# Patient Record
Sex: Female | Born: 1955 | Hispanic: No | Marital: Married | Smoking: Never smoker
Health system: Southern US, Community
[De-identification: ages and names within clinical notes are randomized; demographics above are authoritative.]

## PROBLEM LIST (undated history)

## (undated) DIAGNOSIS — R252 Cramp and spasm: Secondary | ICD-10-CM

## (undated) DIAGNOSIS — Z862 Personal history of diseases of the blood and blood-forming organs and certain disorders involving the immune mechanism: Secondary | ICD-10-CM

## (undated) DIAGNOSIS — B360 Pityriasis versicolor: Secondary | ICD-10-CM

## (undated) DIAGNOSIS — Z5189 Encounter for other specified aftercare: Secondary | ICD-10-CM

## (undated) DIAGNOSIS — M1990 Unspecified osteoarthritis, unspecified site: Secondary | ICD-10-CM

## (undated) DIAGNOSIS — E785 Hyperlipidemia, unspecified: Secondary | ICD-10-CM

## (undated) DIAGNOSIS — K219 Gastro-esophageal reflux disease without esophagitis: Secondary | ICD-10-CM

## (undated) DIAGNOSIS — K922 Gastrointestinal hemorrhage, unspecified: Secondary | ICD-10-CM

## (undated) DIAGNOSIS — F419 Anxiety disorder, unspecified: Secondary | ICD-10-CM

## (undated) DIAGNOSIS — D649 Anemia, unspecified: Secondary | ICD-10-CM

## (undated) HISTORY — DX: Encounter for other specified aftercare: Z51.89

## (undated) HISTORY — DX: Gastro-esophageal reflux disease without esophagitis: K21.9

## (undated) HISTORY — DX: Anxiety disorder, unspecified: F41.9

## (undated) HISTORY — DX: Anemia, unspecified: D64.9

## (undated) HISTORY — DX: Cramp and spasm: R25.2

## (undated) HISTORY — DX: Gastrointestinal hemorrhage, unspecified: K92.2

## (undated) HISTORY — DX: Personal history of diseases of the blood and blood-forming organs and certain disorders involving the immune mechanism: Z86.2

## (undated) HISTORY — DX: Hyperlipidemia, unspecified: E78.5

## (undated) HISTORY — DX: Unspecified osteoarthritis, unspecified site: M19.90

## (undated) HISTORY — DX: Pityriasis versicolor: B36.0

---

## 2007-03-09 HISTORY — PX: BREAST SURGERY: SHX581

## 2010-03-08 DIAGNOSIS — K219 Gastro-esophageal reflux disease without esophagitis: Secondary | ICD-10-CM

## 2010-03-08 DIAGNOSIS — Z5189 Encounter for other specified aftercare: Secondary | ICD-10-CM

## 2010-03-08 DIAGNOSIS — D649 Anemia, unspecified: Secondary | ICD-10-CM

## 2010-03-08 HISTORY — PX: UPPER GASTROINTESTINAL ENDOSCOPY: SHX188

## 2010-03-08 HISTORY — DX: Gastro-esophageal reflux disease without esophagitis: K21.9

## 2010-03-08 HISTORY — DX: Encounter for other specified aftercare: Z51.89

## 2010-03-08 HISTORY — DX: Anemia, unspecified: D64.9

## 2014-03-08 DIAGNOSIS — B360 Pityriasis versicolor: Secondary | ICD-10-CM

## 2014-03-08 HISTORY — DX: Pityriasis versicolor: B36.0

## 2014-09-13 DIAGNOSIS — H9190 Unspecified hearing loss, unspecified ear: Secondary | ICD-10-CM | POA: Insufficient documentation

## 2014-09-13 DIAGNOSIS — IMO0001 Reserved for inherently not codable concepts without codable children: Secondary | ICD-10-CM | POA: Insufficient documentation

## 2014-09-13 DIAGNOSIS — J358 Other chronic diseases of tonsils and adenoids: Secondary | ICD-10-CM | POA: Insufficient documentation

## 2014-09-13 DIAGNOSIS — H7291 Unspecified perforation of tympanic membrane, right ear: Secondary | ICD-10-CM | POA: Insufficient documentation

## 2014-09-13 DIAGNOSIS — H547 Unspecified visual loss: Secondary | ICD-10-CM | POA: Insufficient documentation

## 2014-09-13 DIAGNOSIS — H7192 Unspecified cholesteatoma, left ear: Secondary | ICD-10-CM | POA: Insufficient documentation

## 2014-10-26 DIAGNOSIS — H9193 Unspecified hearing loss, bilateral: Secondary | ICD-10-CM

## 2014-10-26 DIAGNOSIS — J358 Other chronic diseases of tonsils and adenoids: Secondary | ICD-10-CM

## 2014-10-26 DIAGNOSIS — H7291 Unspecified perforation of tympanic membrane, right ear: Secondary | ICD-10-CM

## 2014-10-26 DIAGNOSIS — H547 Unspecified visual loss: Secondary | ICD-10-CM

## 2014-10-26 DIAGNOSIS — H7192 Unspecified cholesteatoma, left ear: Secondary | ICD-10-CM

## 2014-10-26 DIAGNOSIS — K149 Disease of tongue, unspecified: Secondary | ICD-10-CM | POA: Insufficient documentation

## 2014-10-26 DIAGNOSIS — M1990 Unspecified osteoarthritis, unspecified site: Secondary | ICD-10-CM

## 2014-12-30 ENCOUNTER — Encounter: Payer: Self-pay | Admitting: Internal Medicine

## 2014-12-30 ENCOUNTER — Ambulatory Visit (INDEPENDENT_AMBULATORY_CARE_PROVIDER_SITE_OTHER): Payer: Self-pay | Admitting: Internal Medicine

## 2014-12-30 ENCOUNTER — Ambulatory Visit: Payer: Self-pay | Admitting: Internal Medicine

## 2014-12-30 VITALS — BP 108/68 | HR 74 | Ht <= 58 in | Wt 134.0 lb

## 2014-12-30 DIAGNOSIS — K219 Gastro-esophageal reflux disease without esophagitis: Secondary | ICD-10-CM

## 2014-12-30 DIAGNOSIS — B360 Pityriasis versicolor: Secondary | ICD-10-CM | POA: Insufficient documentation

## 2014-12-30 DIAGNOSIS — L259 Unspecified contact dermatitis, unspecified cause: Secondary | ICD-10-CM

## 2014-12-30 NOTE — Progress Notes (Signed)
   Subjective:    Patient ID: Elizabeth Ruiz, female    DOB: Oct 15, 1955, 59 y.o.   MRN: [id number]  HPI  Here for follow up of what appeared to be a symmetric contact dermatitis.  Pt. Was seen for this on 12/06/2014. Given a prednisone burst and taper with good results.  We did get a look at her swimsuit, the latex did not correspond to areas of rash.  Pt. Was convinced this was an allergic reaction to crab she had eaten all week.    2.  Tinea Versicolor:  Pt. Has had this for many years as does her sister Elizabeth Ruiz.  Noted this when checking her skin for the rash at last visit.  Pt. States worse at end of summer.  Her sister uses unknown topical intermittently to control--good control with.    3.  GERD:  States is taking her Famotidine 40 mg every night.  Is elevating her head with a ramp of pillows.  No heartburn now.    4.  Lips and tongue irritation:  Better on Vitamin B complex tablets.    Review of Systems     Objective:   Physical Exam  Skin of Back:  Coalescing pink ovules with mild flaking on upper and mid back and upper arms.        Assessment & Plan:  1.  Contact Dermatitis vs. Allergic reaction:  Doubt this was an allergic reaction to Crab.  Still suspect more so a contact dermatitis.  REsolved.  2.  Tinea Versicolor:  Two options:  To see if sister's selenium sulfide lotion is 2.5% and utilize once daily fir 7 days or may try Terbinafine cream 1% twice daily fo 7 days.    3.  GERD:  Controlled with Famotidine  4.  Tongue and lip complaints--possibly better with Vitamin B complex

## 2014-12-30 NOTE — Patient Instructions (Signed)
You may try selenium sulfide shampoo/lotion once daily (apply and wash off in 10 minutes once daily) for 1 week  OR You may try Terbinafine 1 %:  Apply twice daily for 1 week.

## 2015-04-01 ENCOUNTER — Encounter: Payer: Self-pay | Admitting: Internal Medicine

## 2015-04-01 ENCOUNTER — Telehealth: Payer: Self-pay | Admitting: Internal Medicine

## 2015-04-01 ENCOUNTER — Ambulatory Visit (INDEPENDENT_AMBULATORY_CARE_PROVIDER_SITE_OTHER): Payer: Self-pay | Admitting: Internal Medicine

## 2015-04-01 VITALS — BP 100/68 | HR 76 | Resp 16 | Ht <= 58 in | Wt 136.0 lb

## 2015-04-01 DIAGNOSIS — H9193 Unspecified hearing loss, bilateral: Secondary | ICD-10-CM

## 2015-04-01 DIAGNOSIS — K219 Gastro-esophageal reflux disease without esophagitis: Secondary | ICD-10-CM

## 2015-04-01 DIAGNOSIS — B360 Pityriasis versicolor: Secondary | ICD-10-CM

## 2015-04-01 NOTE — Progress Notes (Signed)
   Subjective:    Patient ID: Elizabeth Ruiz, female    DOB: 03-Apr-1955, 60 y.o.   MRN: [id number]  HPI   1. Tinea versicolor:  Used Terbinafine cream 1%.  Used for 4-5 days once daily.  Feels the skin changes have resolved.  2.  GERD:  Stopped taking Famotidine regularly.  Has not needed for two weeks.   3.  Decreased Hearing:  Unable to find [facility] report from this past summer--not in previous EMR as well.  Had decreased mixed hearing bilaterally, L>R when had [facility] read report over phone.  Pt. Was also referred through [facility] orange card to ENT, but never received the follow up.           Review of Systems     Objective:   Physical Exam HEENT;  Thickened TMs bilaterally--anterior perforation on right.  Left retracted, thickened and scarred but also wet anteriorly.  No definitive perforation. Lungs:  CTA CV:  RRR without murmur or rub. Skin:  No scaling pink lesions on back currently.       Assessment & Plan:  1.  Tinea versicolor:  Controlled.  Discussed treating with Terbinafine cream otc twice daily for 7-14 days if recurs, which is likely.  2.  GERD:  Plans to just use Famotidine as needed.    3.  Bilateral hearing loss:  Received documentation from ENT, Dr. Mickie Hillier office.  Was to return to his office after hearing testing performed, which did not occur.  Called [name] at [facility] (after told need to go through network from Dr [employer] office) to see about getting her back to him.

## 2015-04-01 NOTE — Telephone Encounter (Signed)
Pat called Marzetta Board at [facility] inquire regarding ENT referral for patient.  Stacy informed that patient had service with Dr. Lucia Gaskins on October 03, 2014 at 1:30 p.m.  Pat called Dr. Pollie Friar office to request report of visit.  This information will be faxed to [facility].

## 2015-04-02 ENCOUNTER — Telehealth: Payer: Self-pay | Admitting: Internal Medicine

## 2015-04-02 ENCOUNTER — Encounter: Payer: Self-pay | Admitting: Internal Medicine

## 2015-04-02 DIAGNOSIS — K219 Gastro-esophageal reflux disease without esophagitis: Secondary | ICD-10-CM | POA: Insufficient documentation

## 2015-04-11 NOTE — Telephone Encounter (Signed)
Patient is aware and will contact us if she does not hear from Dr. Lucia Gaskins in the next month

## 2015-04-11 NOTE — Telephone Encounter (Signed)
Called Elizabeth Ruiz to get update about referral. Marzetta Board states she contacted ENT office and was told that they only see patients with orange card one time only and if they need to go back they will be charge as self pay (not sure what the fee is)

## 2015-05-15 NOTE — Telephone Encounter (Signed)
Patient came in today to see if we knew what the self pay cost for her ENT visit with Dr. Lucia Gaskins would be. Called the ENT office today and spoke to front desk about it and they stated someone will get back to me with that information. Also called Stacy last week and she was going to find out and call me back. I have not hear back from Kelford yet. I called her again today and left voice message to return my call  Patient is informed we will be calling her as soon as we hear from them an answer Marlowe Kays from Dr. Lucia Gaskins office states patient would need to pay $95.00 for the visit. If patient agrees to pay, she will need to call them and set up the appointment.

## 2015-05-15 NOTE — Telephone Encounter (Signed)
Patient is informed of the $95.00 self pay  fee for the visit and to call Dr. Pollie Friar office at [phone] to set up appointment.

## 2015-09-11 ENCOUNTER — Encounter: Payer: Self-pay | Admitting: Internal Medicine

## 2015-09-11 ENCOUNTER — Telehealth: Payer: Self-pay | Admitting: Internal Medicine

## 2015-09-11 ENCOUNTER — Ambulatory Visit (INDEPENDENT_AMBULATORY_CARE_PROVIDER_SITE_OTHER): Payer: Self-pay | Admitting: Internal Medicine

## 2015-09-11 VITALS — BP 114/78 | HR 69 | Temp 98.2°F | Resp 14 | Ht <= 58 in | Wt 136.0 lb

## 2015-09-11 DIAGNOSIS — K029 Dental caries, unspecified: Secondary | ICD-10-CM

## 2015-09-11 DIAGNOSIS — Z1239 Encounter for other screening for malignant neoplasm of breast: Secondary | ICD-10-CM

## 2015-09-11 DIAGNOSIS — Z124 Encounter for screening for malignant neoplasm of cervix: Secondary | ICD-10-CM

## 2015-09-11 DIAGNOSIS — M6530 Trigger finger, unspecified finger: Secondary | ICD-10-CM

## 2015-09-11 DIAGNOSIS — Z0000 Encounter for general adult medical examination without abnormal findings: Secondary | ICD-10-CM

## 2015-09-11 DIAGNOSIS — R0600 Dyspnea, unspecified: Secondary | ICD-10-CM

## 2015-09-11 MED ORDER — DICLOFENAC SODIUM 1 % TD GEL: 2.0000 g | Freq: Four times a day (QID) | TRANSDERMAL | Status: DC

## 2015-09-11 NOTE — Progress Notes (Signed)
Subjective:    Patient ID: Elizabeth Ruiz, female    DOB: [birth year], 60 y.o.   MRN: [id number]  HPI   Here for CPE with pap.  Health Maintenance:  Last Pap:  7-8 years ago.  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  SBE:  No  Guaiac Cards:  Never  Colonoscopy:  Never  DEXA:  Never  Immunization History  Administered Date(s) Administered  . Influenza-Unspecified 11/23/2014     Current outpatient prescriptions:  .  famotidine (PEPCID) 20 MG tablet, Take 20 mg by mouth. 2 tablets at bedtime, Disp: , Rfl:  .  Calcium Carbonate-Vit D-Min (CALCIUM 600 + MINERALS PO), Take 1 tablet by mouth 2 (two) times daily with a meal. Reported on 09/11/2015, Disp: , Rfl:    No Known Allergies   Past Medical History  Diagnosis Date  . Upper GI bleed     NSAID provoked gastritis  . Arthritis   . Tinea versicolor 2016  . GERD (gastroesophageal reflux disease)     Past Surgical History  Procedure Laterality Date  . Breast surgery Right 2009    Brease Biopsy with benign Fibroma resulting    Family History  Problem Relation Age of Onset  . Diabetes Mellitus II Mother   . Hypercholesterolemia Father   . Diabetes Mellitus II Sister           Review of Systems  Constitutional: Negative for fatigue.  HENT: Positive for dental problem, hearing loss and tinnitus. Negative for trouble swallowing.        Some cavities that need attention.  Has been evaluated by [facility] and ENT:  Reportedly needs surgery to repair ?TM to improve hearing.  Has been told the "nerves"  To her ears are okay.  Eyes: Negative for visual disturbance.       Wears glasses, working well for her vision. Last eye check 6 months ago--[address] [facility]  Respiratory: Positive for shortness of breath. Negative for cough.        Swims laps 20 minutes without dyspnea (not for a month,  however), but has noted some dyspnea in past 5 years if walks up 1 flight of stairs--mild.   Has been asked why she is breathing heavily while walking and talking on phone. Husband, Gjerji, has not noted her breathing heavily with walking. No itchy, watery eyes, nose or throat.    Cardiovascular: Negative for chest pain, palpitations and leg swelling.  Endocrine: Negative for cold intolerance, polydipsia and polyuria.  Genitourinary: Negative for dysuria, urgency and hematuria.       No menstruation since age 69 yo.  Musculoskeletal: Positive for arthralgias.       Right handed.  Has pain at MCP joint of right thumb.  Has been painful for 2 weeks.  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  Has not taken anything for the pain.  Has Voltaren cream from her sister and has helped.  Neurological: Negative for dizziness, weakness and numbness.  Hematological: Does not bruise/bleed easily.  Psychiatric/Behavioral: Negative for dysphoric mood. The patient is not nervous/anxious.        Objective:   Physical Exam  Constitutional: She is oriented to person, place, and time. She appears well-developed and well-nourished.  HENT:  Head: Normocephalic and atraumatic.  Right Ear: External ear normal.  Left  Ear: External ear normal.  Nose: Nose normal.  Mouth/Throat: Oropharynx is clear and moist.  Eyes: Conjunctivae and EOM are normal. Pupils are equal, round, and reactive to light.  Fundoscopic exam:      The right eye shows no AV nicking, no exudate and no hemorrhage.       The left eye shows no AV nicking, no exudate and no hemorrhage.  Discs sharp Left eye with mild prominence/proptosis compared to right, as is her baseline and without change  Neck: Normal range of motion. Neck supple. No thyroid mass and no thyromegaly present.  Cardiovascular: Normal rate, regular rhythm, S1 normal, S2 normal and normal heart sounds.  Exam reveals no S3, no S4  and no friction rub.   No murmur heard. No carotid bruits.  Carotid, radial, femoral, DP and PT pulses normal and equal.    Pulmonary/Chest: Effort normal and breath sounds normal. No accessory muscle usage. No respiratory distress. She has no wheezes. She has no rales. Right breast exhibits no inverted nipple, no mass, no nipple discharge, no skin change and no tenderness. Left breast exhibits inverted nipple. Left breast exhibits no mass, no nipple discharge, no skin change and no tenderness.  Chest resonant to percussion  Abdominal: Soft. Bowel sounds are normal. She exhibits no mass. There is no hepatosplenomegaly. There is no tenderness. No hernia. Hernia confirmed negative in the right inguinal area and confirmed negative in the left inguinal area.  Genitourinary: Rectum normal, vagina normal and uterus normal. Rectal exam shows no mass, no tenderness and anal tone normal. Guaiac negative stool. Cervix exhibits no motion tenderness and no discharge. Right adnexum displays no mass, no tenderness and no fullness. Left adnexum displays no mass, no tenderness and no fullness.  Musculoskeletal: Normal range of motion.       Arms: Lymphadenopathy:       Head (right side): No submental and no submandibular adenopathy present.       Head (left side): No submandibular adenopathy present.    She has no cervical adenopathy.    She has no axillary adenopathy.       Right: No inguinal adenopathy present.       Left: No inguinal adenopathy present.  Neurological: She is alert and oriented to person, place, and time. She has normal reflexes. No cranial nerve deficit or sensory deficit. She exhibits normal muscle tone. Coordination and gait normal.  Skin: Skin is warm and dry. No rash noted.  Back with many light brown to dark brown well circumscribed lesions.  No flaking or bleeding.  Psychiatric: She has a normal mood and affect. Her behavior is normal. Judgment and thought content normal.            Assessment & Plan:  1.  CPE with Pap Pap taken and sent Wet Prep with clue cells, negative whiff and patient without symptoms. Schedule Mammogram through scholarship FLP, CMP, CBC today. Guaiac Cards x 3, to return when back for trigger injection.  Guaiac card negative for blood today. Tdap did not want today--she does not feel at this age she requires the immunization despite our counseling and recommendation.   2.  Left TM damage, reportedly requiring surgery.  Believe pt. Sent to Dr. Lucia Gaskins through [facility] orange card.  Have not received most recent office note.  Will discuss possibility of surgery with Dr. Lucia Gaskins and whether to be done at [facility], where family can get Financial Assistance.  3.  Trigger finger, right thumb:  Will return  for injection.  Check with [facility] regarding Diclofenac Cream twice daily.  4.  Dental Decay:  Dental referral  5.  Dyspnea:  EKG WNL.  Discussed to gradually increase daily physical activity.  Feel this is likely due to her lack of physical activity in recent year.

## 2015-09-11 NOTE — Telephone Encounter (Signed)
Called Dr. Pollie Friar office, ENT, where patient ultimately was seen in March for her left ear concerns.  They will fax her visit there.  I will look at recommendations and then discuss with Dr. Lucia Gaskins subsequently.

## 2015-09-12 LAB — CBC WITH DIFFERENTIAL/PLATELET
Basophils Absolute: 0 10*3/uL (ref 0.0–0.2)
Basos: 1 %
EOS (ABSOLUTE): 0.2 10*3/uL (ref 0.0–0.4)
EOS: 4 %
HEMATOCRIT: 37.2 % (ref 34.0–46.6)
HEMOGLOBIN: 11.9 g/dL (ref 11.1–15.9)
IMMATURE GRANS (ABS): 0 10*3/uL (ref 0.0–0.1)
IMMATURE GRANULOCYTES: 0 %
LYMPHS: 43 %
Lymphocytes Absolute: 2.5 10*3/uL (ref 0.7–3.1)
MCH: 29.8 pg (ref 26.6–33.0)
MCHC: 32 g/dL (ref 31.5–35.7)
MCV: 93 fL (ref 79–97)
MONOCYTES: 8 %
Monocytes Absolute: 0.4 10*3/uL (ref 0.1–0.9)
NEUTROS PCT: 44 %
Neutrophils Absolute: 2.6 10*3/uL (ref 1.4–7.0)
Platelets: 281 10*3/uL (ref 150–379)
RBC: 4 x10E6/uL (ref 3.77–5.28)
RDW: 13.5 % (ref 12.3–15.4)
WBC: 5.8 10*3/uL (ref 3.4–10.8)

## 2015-09-12 LAB — COMPREHENSIVE METABOLIC PANEL
ALBUMIN: 4.2 g/dL (ref 3.5–5.5)
ALT: 6 IU/L (ref 0–32)
AST: 14 IU/L (ref 0–40)
Albumin/Globulin Ratio: 1.4 (ref 1.2–2.2)
Alkaline Phosphatase: 58 IU/L (ref 39–117)
BUN/Creatinine Ratio: 17 (ref 9–23)
BUN: 11 mg/dL (ref 6–24)
Bilirubin Total: 0.6 mg/dL (ref 0.0–1.2)
CALCIUM: 9.3 mg/dL (ref 8.7–10.2)
CO2: 24 mmol/L (ref 18–29)
CREATININE: 0.66 mg/dL (ref 0.57–1.00)
Chloride: 102 mmol/L (ref 96–106)
GFR calc Af Amer: 112 mL/min/{1.73_m2} (ref >59)
GFR, EST NON AFRICAN AMERICAN: 97 mL/min/{1.73_m2} (ref >59)
GLOBULIN, TOTAL: 3 g/dL (ref 1.5–4.5)
Glucose: 89 mg/dL (ref 65–99)
Potassium: 4.3 mmol/L (ref 3.5–5.2)
SODIUM: 141 mmol/L (ref 134–144)
TOTAL PROTEIN: 7.2 g/dL (ref 6.0–8.5)

## 2015-09-12 LAB — LIPID PANEL W/O CHOL/HDL RATIO
Cholesterol, Total: 233 mg/dL — ABNORMAL HIGH (ref 100–199)
HDL: 76 mg/dL (ref >39)
LDL Calculated: 137 mg/dL — ABNORMAL HIGH (ref 0–99)
Triglycerides: 99 mg/dL (ref 0–149)
VLDL Cholesterol Cal: 20 mg/dL (ref 5–40)

## 2015-09-12 LAB — CYTOLOGY - PAP

## 2015-09-15 ENCOUNTER — Ambulatory Visit (INDEPENDENT_AMBULATORY_CARE_PROVIDER_SITE_OTHER): Payer: Self-pay | Admitting: Internal Medicine

## 2015-09-15 DIAGNOSIS — M6530 Trigger finger, unspecified finger: Secondary | ICD-10-CM

## 2015-09-15 MED ORDER — METHYLPREDNISOLONE ACETATE 40 MG/ML IJ SUSP: 40.0000 mg | Freq: Once | INTRAMUSCULAR | Status: DC

## 2015-09-15 NOTE — Progress Notes (Addendum)
Patient here for injection of right trigger thumb.  She did not purchase SPICA splint as recommended at last visit to wear for 14 days after injection. Discussed possible complications and benefits. Patient signed informed consent paperwork.  Right palmar base of thumb cleaned in sterile fashion, Lidocaine 1% 0.5 ml and 1 ml or 40 mg of Dep Medrol injected to pulley area of triggering tendon.  Pt. Tolerated well without complication.   Bandaid applied  UNable to enter LOt and Expiration in chart the usual way,  Depo-Medrol 40 mg/ml with Lot:  [id number] and exp of 09/2016  A/P:  Right Triggering Thumb:  Injected at pulley site.  To splint for 14 days with removable SPICA splint.  Gradual, gentle ROM thereafter. To call for signs of infection:  Redness, swelling, discharge, increased pain, or fever.  Also, discussed normal pap, labs done last week.

## 2015-09-15 NOTE — Addendum Note (Signed)
Addended by: Marcelino Duster on: 09/15/2015 04:59 PM   Modules accepted: Orders

## 2015-09-15 NOTE — Patient Instructions (Addendum)
Right SPICA splint:  Go to [facility] in splint area or [facility] or [facility] Wear splint on right thumb for 2 weeks.  Only remove for washing hands or taking a shower or swimming.  Call for redness, swelling, fever, increased pain

## 2015-09-22 ENCOUNTER — Telehealth (HOSPITAL_COMMUNITY): Payer: Self-pay | Admitting: *Deleted

## 2015-09-22 NOTE — Telephone Encounter (Signed)
Telephoned patient at home # and left message to return call to [facility] 

## 2016-01-12 ENCOUNTER — Encounter: Payer: Self-pay | Admitting: Internal Medicine

## 2016-02-13 ENCOUNTER — Encounter: Payer: Self-pay | Admitting: Internal Medicine

## 2016-02-13 ENCOUNTER — Ambulatory Visit (INDEPENDENT_AMBULATORY_CARE_PROVIDER_SITE_OTHER): Payer: Self-pay | Admitting: Internal Medicine

## 2016-02-13 VITALS — BP 112/82 | HR 68 | Resp 12 | Ht <= 58 in | Wt 141.0 lb

## 2016-02-13 DIAGNOSIS — H7192 Unspecified cholesteatoma, left ear: Secondary | ICD-10-CM

## 2016-02-13 DIAGNOSIS — H9193 Unspecified hearing loss, bilateral: Secondary | ICD-10-CM

## 2016-02-13 DIAGNOSIS — H7291 Unspecified perforation of tympanic membrane, right ear: Secondary | ICD-10-CM

## 2016-02-13 DIAGNOSIS — H8303 Labyrinthitis, bilateral: Secondary | ICD-10-CM

## 2016-02-13 NOTE — Progress Notes (Signed)
   Subjective:    Patient ID: Elizabeth Ruiz, female    DOB: 1955-05-09, 60 y.o.   MRN: [id number]  HPI   Balance or spinning sensation with head movement in past 3 weeks.  States especially when rolling over in bed or bending over to tie shoes.   Her symptoms have improved with time--lasts fewer seconds. Can have nausea with dizziness.   Did have left ear pain and maybe congestion about 1 month ago.   Does get numbness and tingling in her bilateral hands, but not associated with her vertiginous symptoms--generally at night.   Has had some pain in left cervical paraspinous musculature about the same time period--constant.    Has not taken any medication for any of these symptoms.    No outpatient prescriptions have been marked as taking for the 02/13/16 encounter (Office Visit) with Mack Hook, MD.    No Known Allergies      Review of Systems     Objective:   Physical Exam   NAD HEENT:  PERRL, EOMI, TMs  Right with perforation,  left with thickening, scarring and possible cholesteotoma anteriorly--area is wet appearing.  Throat without injection. Neck:  Supple, No adenopathy Chest:  CTA CV:  RRR with normal S1 and S2, No S3, S4 or murmur. Neuro:  A & O x 3, CN II-XII grossly intact, Motor 5/5, DTRs 2+/4 throughout.  Rapid alternating motions, finger to nose to finger normal. Romberg negative.  Gait normal.   Christella Noa maneuver negative for nystagmus, but does have mild vertiginous symptoms when sits back up         Assessment & Plan:  1.  Labrynthitis:  Appears to be resolving.  Discussed medication would more likely cause sedation rather than really helping at this point.  To call if does not gradually resolve.  2.  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  Will refer to [facility] to see if able to obtain treatment there.  3.  Neck pain:  Ibuprofen and  warm packs as needed.

## 2016-02-13 NOTE — Patient Instructions (Addendum)
Ibuprofen 200 mg try 2-3 every 6 hours with food for neck pain

## 2016-05-19 ENCOUNTER — Ambulatory Visit (INDEPENDENT_AMBULATORY_CARE_PROVIDER_SITE_OTHER): Payer: Self-pay | Admitting: Internal Medicine

## 2016-05-19 ENCOUNTER — Encounter: Payer: Self-pay | Admitting: Internal Medicine

## 2016-05-19 VITALS — BP 124/82 | HR 78 | Resp 14 | Ht <= 58 in | Wt 141.0 lb

## 2016-05-19 DIAGNOSIS — G8929 Other chronic pain: Secondary | ICD-10-CM

## 2016-05-19 DIAGNOSIS — M25562 Pain in left knee: Secondary | ICD-10-CM

## 2016-05-19 MED ORDER — TART CHERRY ADVANCED PO CAPS: ORAL_CAPSULE | ORAL | Status: DC

## 2016-05-19 NOTE — Patient Instructions (Signed)
[facility] [address]  [place], [place] [address] [phone]  Ask for Zannie Cove and have them give you a recommended dosing for ongoing pain and then preventive dosing once the pain has improved.    40 % discount for [facility] patients

## 2016-05-19 NOTE — Progress Notes (Signed)
   Subjective:    Patient ID: Elizabeth Ruiz, female    DOB: January 05, 1956, 61 y.o.   MRN: [id number]  HPI   1.  Pain in left knee started soon after being seen 02/12/2017.  Noted the pain started to run to catch a bus.  Did not fall and injure.  Had really bad pain 2 days later.  Has seemed to gradually worsen with time.  Has noted swelling behind her knee.  No definite erythema associated.  Now with pain at times even when just lying down.  Does not use a pillow to support knee from beneath.  Pain is worse with weight bearing.   Has never had problems with her left knee before.  Has never had it xrayed.   Voltaren gel,which she had left over helps, but just a bit.    2. Hair loss:  Would like to have thyroid checked  Current Meds  Medication Sig  . Omega-3 Fatty Acids (OMEGA-3 FISH OIL PO) Take by mouth daily.   No Known Allergies   Review of Systems    Objective:   Physical Exam NAD Bilateral knees with adiposity. Minimal decrease in flexion with left knee.  Normal extension and extension against opposing force.  NT with compression of patella against anterior knee.   Tender all around anterior and medial lateral joint line, but really nontender over posterior knee today and no swelling noted.  Mild tenderness in upper gastroc area without palpation of swelling or mass. No effusion, no other soft tissue swelling.       Assessment & Plan:  1.  Left Knee pain:  Not clear if internal derangement or OA changes.  No palpation of Baker's cyst, though with extra soft tissue may be missing.   Send for Xray at [facility] and apply for financial assistance.  2.  Hair Loss:  Had planned to obtain TSH, but failed to send to lab after appt.  Will check at next appt.  3.  Trigger thumb:  This was a BTW at discharge:  Still a problem.  Will see what we find with knee and consider ortho referral to [facility] for both concerns if necessary or just the thumb.  4.  Hearing loss with perforated TM:  Referral  to [facility] has not been completed from December.  Given financial assistance paperwork to get started and set up appt.  Could not afford needed surgery through ENT locally.

## 2016-05-24 ENCOUNTER — Ambulatory Visit (HOSPITAL_COMMUNITY)
Admission: RE | Admit: 2016-05-24 | Discharge: 2016-05-24 | Disposition: A | Discharge status: Home / Self Care | Payer: Self-pay | Source: Ambulatory Visit | Attending: Internal Medicine | Admitting: Internal Medicine

## 2016-05-24 DIAGNOSIS — M1712 Unilateral primary osteoarthritis, left knee: Secondary | ICD-10-CM | POA: Insufficient documentation

## 2016-05-24 DIAGNOSIS — M25562 Pain in left knee: Secondary | ICD-10-CM | POA: Insufficient documentation

## 2016-05-24 DIAGNOSIS — G8929 Other chronic pain: Secondary | ICD-10-CM | POA: Insufficient documentation

## 2016-07-16 ENCOUNTER — Other Ambulatory Visit: Payer: Self-pay | Admitting: Obstetrics and Gynecology

## 2016-07-16 DIAGNOSIS — Z1231 Encounter for screening mammogram for malignant neoplasm of breast: Secondary | ICD-10-CM

## 2016-07-29 ENCOUNTER — Ambulatory Visit
Admission: RE | Admit: 2016-07-29 | Discharge: 2016-07-29 | Disposition: A | Discharge status: Home / Self Care | Payer: No Typology Code available for payment source | Source: Ambulatory Visit | Attending: Obstetrics and Gynecology | Admitting: Obstetrics and Gynecology

## 2016-07-29 ENCOUNTER — Encounter (HOSPITAL_COMMUNITY): Payer: Self-pay

## 2016-07-29 ENCOUNTER — Ambulatory Visit (HOSPITAL_COMMUNITY)
Admission: RE | Admit: 2016-07-29 | Discharge: 2016-07-29 | Disposition: A | Discharge status: Home / Self Care | Payer: Self-pay | Source: Ambulatory Visit | Attending: Obstetrics and Gynecology | Admitting: Obstetrics and Gynecology

## 2016-07-29 VITALS — BP 128/84 | Temp 97.6°F | Wt 140.8 lb

## 2016-07-29 DIAGNOSIS — Z1231 Encounter for screening mammogram for malignant neoplasm of breast: Secondary | ICD-10-CM

## 2016-07-29 DIAGNOSIS — Z1239 Encounter for other screening for malignant neoplasm of breast: Secondary | ICD-10-CM

## 2016-07-29 NOTE — Progress Notes (Signed)
No complaints today.   Pap Smear: Pap smear not completed today. Last Pap smear was 09/11/2015 at the [facility] and normal. Per patient has no history of an abnormal Pap smear. Last Pap smear result is in EPIC.  Physical exam: Breasts Breasts symmetrical. No skin abnormalities bilateral breasts. No nipple retraction bilateral breasts. No nipple discharge bilateral breasts. No lymphadenopathy. No lumps palpated bilateral breasts. No complaints of pain or tenderness on exam. Referred patient to the [facility] for a screening mammogram. Appointment scheduled for Thursday, Jul 29, 2016 at 1110.        Pelvic/Bimanual No Pap smear completed today since last Pap smear was 09/11/2015. Pap smear not indicated per [facility] guidelines.   Smoking History: Patient has never smoked.  Patient Navigation: Patient education provided. Access to services provided for patient through [facility] program.   Colorectal Cancer Screening: Per patient has never had a colonoscopy completed. No complaints today. FIT Test given to patient to complete and return to [facility].

## 2016-07-29 NOTE — Patient Instructions (Signed)
Explained breast self awareness with [employer]. Patient did not need a Pap smear today due to last Pap smear was 09/11/2015. Let her know [facility] will cover Pap smears every 3 years unless has a history of abnormal Pap smears. Referred patient to the [facility] for a screening mammogram. Appointment scheduled for Thursday, Jul 29, 2016 at 1110. Let patient know the [facility] will follow up with her within the next couple weeks with results of mammogram by letter or phone. Nylani Lundy verbalized understanding.  [place], Arvil Chaco, RN 1:38 PM

## 2016-08-03 ENCOUNTER — Encounter (HOSPITAL_COMMUNITY): Payer: Self-pay | Admitting: *Deleted

## 2016-08-04 ENCOUNTER — Other Ambulatory Visit: Payer: Self-pay

## 2016-08-13 LAB — FECAL OCCULT BLOOD, IMMUNOCHEMICAL: Fecal Occult Bld: NEGATIVE

## 2016-08-23 ENCOUNTER — Encounter: Payer: Self-pay | Admitting: Internal Medicine

## 2016-08-23 ENCOUNTER — Ambulatory Visit (INDEPENDENT_AMBULATORY_CARE_PROVIDER_SITE_OTHER): Payer: Self-pay | Admitting: Internal Medicine

## 2016-08-23 VITALS — BP 118/78 | HR 62 | Resp 12 | Ht <= 58 in | Wt 141.0 lb

## 2016-08-23 DIAGNOSIS — Z862 Personal history of diseases of the blood and blood-forming organs and certain disorders involving the immune mechanism: Secondary | ICD-10-CM

## 2016-08-23 DIAGNOSIS — M1990 Unspecified osteoarthritis, unspecified site: Secondary | ICD-10-CM

## 2016-08-23 DIAGNOSIS — E785 Hyperlipidemia, unspecified: Secondary | ICD-10-CM

## 2016-08-23 DIAGNOSIS — H6592 Unspecified nonsuppurative otitis media, left ear: Secondary | ICD-10-CM

## 2016-08-23 DIAGNOSIS — R42 Dizziness and giddiness: Secondary | ICD-10-CM

## 2016-08-23 DIAGNOSIS — E7800 Pure hypercholesterolemia, unspecified: Secondary | ICD-10-CM

## 2016-08-23 DIAGNOSIS — M1811 Unilateral primary osteoarthritis of first carpometacarpal joint, right hand: Secondary | ICD-10-CM

## 2016-08-23 DIAGNOSIS — M7050 Other bursitis of knee, unspecified knee: Secondary | ICD-10-CM

## 2016-08-23 HISTORY — DX: Hyperlipidemia, unspecified: E78.5

## 2016-08-23 HISTORY — DX: Personal history of diseases of the blood and blood-forming organs and certain disorders involving the immune mechanism: Z86.2

## 2016-08-23 MED ORDER — AMOXICILLIN 500 MG PO CAPS: 500.0000 mg | ORAL_CAPSULE | Freq: Three times a day (TID) | ORAL | 0 refills | Status: DC

## 2016-08-23 NOTE — Progress Notes (Signed)
   Subjective:    Patient ID: Elizabeth Ruiz, female    DOB: 10/24/1955, 61 y.o.   MRN: [id number]  HPI  1.  Left Knee Pain:  Not much pain, but not walking well.  Not clear if stiffness or if she is having pain, but just used to the discomfort.  Her Xray showed tricompartmental DJD changes.  She decided to just give it more time.   She did start tart cherry, perhaps this has helped.  2.  Episode of vertigo again about 1 month ago.  Fell to floor with room spinning.  Has had small episodes since.  History of vertigo.  Sometimes gets nauseated, but no vomiting.  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  3.  Mild hyperlipidemia with high HDL.  HDL was 76  4.  Would like to be checked for anemia--has had in the past with upper GI bleed years ago.  Feels a bit week at times.    5.  Right thumb not necessarily triggering, but discomfort and stiffness.  Current Meds  Medication Sig  . Misc Natural Products (TART CHERRY ADVANCED) CAPS Use as recommended by [facility]    No Known Allergies Review of Systems     Objective:   Physical Exam   NAD HEENT:  PERRL, EOMI, right TM with scarring anteriorly.  Cannot make out perforation today.  Left TM erythematous diffusely and retracted.  Throat without injection.   Neck:  Supple, No adenopathy.  Tender over left paraspinous tendons to left nuchal area insertion. Chest:  CTA CV:  RRR without murmur or rub,  No carotid bruits,  Carotid, radial pulses normal and equal. Right thumb with hypertrophic change at MCP and IP joint.  Mild decrease in flexion ROM, but no triggering noted today. Left knee with tenderness actually at medial muscle/soft tissue area to pes anserine insertion/bursa site.  No definitive tenderness of joint lines, popliteal fossa or with stress of cruciates or collateral ligaments.  No palpable effusion.        Assessment & Plan:  1.  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  Continue Ebony.  Not a candidate for NSAIDS with history of UGI bleed.  2.  Vertigo: likely related to ear concerns:  Checking to see why she never received a referral to [facility] in [facility].  Was told she needed surgery here, but was unable to afford the surgery.  3.  Left OM--appears acute:  Amoxicillin 500 mg 3 times daily for 10 days.  Will recheck in 1 month.  4.  Hyperlipidemia with good HDL:  FLP  5.  History of anemia:  CBC  6.  HM:  Patient not interested in CPE this year.  Did have normal Mammogram in May.  Discussed would recommend screening for her regularly as she is generally quite healthy and would expect her to be much longer lived and be able to tolerated screening well also. Tdap next visit if will allow--refused in July of 2017.

## 2016-08-24 LAB — CBC WITH DIFFERENTIAL/PLATELET
BASOS ABS: 0.1 10*3/uL (ref 0.0–0.2)
BASOS: 1 %
EOS (ABSOLUTE): 0.1 10*3/uL (ref 0.0–0.4)
Eos: 3 %
HEMOGLOBIN: 11.9 g/dL (ref 11.1–15.9)
Hematocrit: 37.9 % (ref 34.0–46.6)
IMMATURE GRANS (ABS): 0 10*3/uL (ref 0.0–0.1)
IMMATURE GRANULOCYTES: 0 %
LYMPHS: 39 %
Lymphocytes Absolute: 2.2 10*3/uL (ref 0.7–3.1)
MCH: 29.8 pg (ref 26.6–33.0)
MCHC: 31.4 g/dL — ABNORMAL LOW (ref 31.5–35.7)
MCV: 95 fL (ref 79–97)
MONOCYTES: 7 %
Monocytes Absolute: 0.4 10*3/uL (ref 0.1–0.9)
NEUTROS PCT: 50 %
Neutrophils Absolute: 2.8 10*3/uL (ref 1.4–7.0)
PLATELETS: 274 10*3/uL (ref 150–379)
RBC: 3.99 x10E6/uL (ref 3.77–5.28)
RDW: 13.3 % (ref 12.3–15.4)
WBC: 5.5 10*3/uL (ref 3.4–10.8)

## 2016-08-24 LAB — LIPID PANEL W/O CHOL/HDL RATIO
CHOLESTEROL TOTAL: 255 mg/dL — AB (ref 100–199)
HDL: 70 mg/dL (ref >39)
LDL Calculated: 159 mg/dL — ABNORMAL HIGH (ref 0–99)
Triglycerides: 131 mg/dL (ref 0–149)
VLDL CHOLESTEROL CAL: 26 mg/dL (ref 5–40)

## 2016-08-25 ENCOUNTER — Encounter (HOSPITAL_COMMUNITY): Payer: Self-pay | Admitting: *Deleted

## 2016-08-25 NOTE — Progress Notes (Signed)
Letter mailed to patient with negative Fit Test results.  

## 2016-10-04 ENCOUNTER — Ambulatory Visit: Payer: Self-pay | Admitting: Internal Medicine

## 2016-11-01 ENCOUNTER — Ambulatory Visit: Payer: Self-pay | Admitting: Internal Medicine

## 2016-12-27 ENCOUNTER — Encounter: Payer: Self-pay | Admitting: Internal Medicine

## 2016-12-27 ENCOUNTER — Ambulatory Visit (INDEPENDENT_AMBULATORY_CARE_PROVIDER_SITE_OTHER): Payer: Self-pay | Admitting: Internal Medicine

## 2016-12-27 VITALS — BP 128/82 | HR 78 | Resp 12 | Ht <= 58 in | Wt 139.0 lb

## 2016-12-27 DIAGNOSIS — Z23 Encounter for immunization: Secondary | ICD-10-CM

## 2016-12-27 DIAGNOSIS — E7800 Pure hypercholesterolemia, unspecified: Secondary | ICD-10-CM

## 2016-12-27 DIAGNOSIS — G5601 Carpal tunnel syndrome, right upper limb: Secondary | ICD-10-CM

## 2016-12-27 DIAGNOSIS — H7192 Unspecified cholesteatoma, left ear: Secondary | ICD-10-CM

## 2016-12-27 DIAGNOSIS — H9193 Unspecified hearing loss, bilateral: Secondary | ICD-10-CM

## 2016-12-27 DIAGNOSIS — H7291 Unspecified perforation of tympanic membrane, right ear: Secondary | ICD-10-CM

## 2016-12-27 DIAGNOSIS — M1712 Unilateral primary osteoarthritis, left knee: Secondary | ICD-10-CM

## 2016-12-27 MED ORDER — INFLUENZA VAC SPLIT QUAD 0.5 ML IM SUSY: 0.5000 mL | PREFILLED_SYRINGE | Freq: Once | INTRAMUSCULAR | 0 refills | Status: DC

## 2016-12-27 MED ORDER — INFLUENZA VAC SPLIT QUAD 0.5 ML IM SUSY: 0.5000 mL | PREFILLED_SYRINGE | Freq: Once | INTRAMUSCULAR | 0 refills | Status: AC

## 2016-12-27 NOTE — Patient Instructions (Addendum)
Wear cock up splint nightly when you are having numbness in your hand and arm  Consider restarting the tart cherry--you can buy the concentrate at [employer] now or go back to [facility].  Get the vaccine for shingles through the public health department vaccine clinic--call to be sure they carry it.  Elizabeth Ruiz should get the PCV (pneumococcal) 13 after November --can check the vaccine clinic for that as well.  Please bring in documentation of the vaccines

## 2016-12-27 NOTE — Progress Notes (Signed)
   Subjective:    Patient ID: Elizabeth Ruiz, female    DOB: 1955/03/13, 61 y.o.   MRN: [id number]  HPI   1.  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   I am not familiar with this or if only for hearing aides.  Discussed would need to check with [facility].  2.  Left Knee Pain:  Did go to [facility].  Was given exercises to do with weigh machines.  Was told to get on quad machine so [facility] and got [facility].  Is feeling much better with knee pain.  Can get into the pool, but unable to fit both exercises in at this point.  Did use the tart cherry from [employer], which she feels helped as well.  Used for 2 months and then stopped.    3.  Bilateral hand numbness at night:  Has had this for years.  Does use keyboard regularly for her job.  If shakes out hands, resolves.  She cannot recall if improved when on tart cherry.  4.  Right thumb DJD:  Doing fine now.  No outpatient prescriptions have been marked as taking for the 12/27/16 encounter (Office Visit) with Mack Hook, MD.      Review of Systems     Objective:   Physical Exam NAD Lungs:  CTA CV: RRR without murmur or rub MS:  Mild tenderness of right base of thumb, full ROM, No erythema or swelling.   Left Knee: full ROM, hypertrophic changes, no swelling or erythema.  Gait is normal Neuro:  + Tinels and Phalens of median nerve, right wrist.        Assessment & Plan:  1.  Left knee pain:  Resolved with regular quad strengthening exercises.  To continue  2.  Right thumb pain: controlled  3.  Immunization questions:  Rx for influenza vaccine at [facility] for free To check with vaccine clinic at [facility] for shingles vaccine.  4.  Bilateral TM abnormalities with hearing loss:  Need to call [facility] for  clarification of what Ms. Housh is saying today.  Not clear if her recommendations were regarding hearing aides or repair of TMs  5.  Hypercholesterolemia:  FLP today.

## 2016-12-28 LAB — LIPID PANEL W/O CHOL/HDL RATIO
Cholesterol, Total: 234 mg/dL — ABNORMAL HIGH (ref 100–199)
HDL: 76 mg/dL (ref >39)
LDL Calculated: 141 mg/dL — ABNORMAL HIGH (ref 0–99)
Triglycerides: 85 mg/dL (ref 0–149)
VLDL CHOLESTEROL CAL: 17 mg/dL (ref 5–40)

## 2017-07-27 ENCOUNTER — Ambulatory Visit: Payer: BLUE CROSS/BLUE SHIELD | Admitting: Internal Medicine

## 2017-07-27 ENCOUNTER — Encounter: Payer: Self-pay | Admitting: Internal Medicine

## 2017-07-27 VITALS — BP 126/82 | HR 70 | Resp 12 | Ht <= 58 in | Wt 142.0 lb

## 2017-07-27 DIAGNOSIS — Z1159 Encounter for screening for other viral diseases: Secondary | ICD-10-CM | POA: Diagnosis not present

## 2017-07-27 DIAGNOSIS — R5383 Other fatigue: Secondary | ICD-10-CM

## 2017-07-27 DIAGNOSIS — Z114 Encounter for screening for human immunodeficiency virus [HIV]: Secondary | ICD-10-CM

## 2017-07-27 DIAGNOSIS — Z1211 Encounter for screening for malignant neoplasm of colon: Secondary | ICD-10-CM

## 2017-07-27 DIAGNOSIS — G5601 Carpal tunnel syndrome, right upper limb: Secondary | ICD-10-CM

## 2017-07-27 DIAGNOSIS — H7291 Unspecified perforation of tympanic membrane, right ear: Secondary | ICD-10-CM

## 2017-07-27 DIAGNOSIS — H7192 Unspecified cholesteatoma, left ear: Secondary | ICD-10-CM

## 2017-07-27 NOTE — Progress Notes (Signed)
   Subjective:    Patient ID: Elizabeth Ruiz, female    DOB: 1955/11/24, 62 y.o.   MRN: [id number]  HPI   1.  Weakness and general malaise over past month.  Is also having a second of dizziness with movement--feels when turns over in bed.  Dizziness can occur when sitting at computer working.  She does not believe she moves her head prior. She is not certain if she had cold or allergy symptoms prior to her dizziness symptoms.   Sometimes, she does have ear pain with the dizzy symptoms.  She is not sure if she gets nauseated with this.  She does often feel earlier satiety with eating when asked about nausea. She has not lost weight. Stool is of small caliber and spiral like.  Sometimes has to strain to pass it. She eats fruits and veggies, but takes the skin off before eating. She feels she is losing hair and has dry skin, but is not sure it's any worse than usual. No melena or hematochezia. No NSAIDS Has been using Turmeric again.  She is postmenopausal--no periods  2.  Carpal Tunnel Syndrome:  Wore the splints at bedtime for 2 weeks with improvement, then stopped.  Has symptoms again, but has not started wearing the splints.  Also notes her fingers and hands are swollen as well.  3.  Hypercholesterolemia with high HDL:  Not very physically active.  Walks with husband twice weekly. Eats fish twice weekly, mainly salmon, but other types as well.  Her last cholesterol panel in October was fine with elevated HDL, but would like to see LDL lower than here 141.  No outpatient medications have been marked as taking for the 07/27/17 encounter (Office Visit) with Mack Hook, MD.    No Known Allergies  Review of Systems     Objective:   Physical Exam   NAD HEENT:  PERRL, EOMI, discs sharp, Nasal mucosa without swelling, throat without injection or swelling.  Left TM thickened and pink unable to see if still with perforation.  Right TM--unable to identify perforation. Neck:  Supple, No  adenopathy Chest:  CTA CV:  RRR without murmur or rub, radial and DP pulses normal and equal Abd:  S, NT, No HSM or mass, + BS Neuro:  A & O x 3, CN II-XII grossly intact, DTRs 2+/4, motor 5/5, sensory grossly normal. Gait normal.  Minimal symptoms of dizziness with Hall Pike maneuver bilaterally.    Did not reexamine for CTS    Assessment & Plan:  1.  Fatigue: CBC, CMP, TSH, has also not had screening Hep C and HIV and will obtain with her fatigue complaints as well. Encouraged to stop Turmeric with reports of liver involvement with use.  2.  Dizziness:  Describes mild vertigo.  Possibly related to TM abnormalities.  Rerefer back to ENT for definitive treatment.  3.  Small caliber stools:  Has never undergone colonoscopy, screening or otherwise.  Referral to GI.  Specialist to call husband, [place] phone for appt.  4.  Hypercholesterolemia:  To continue to work on lifestyle changes.  5.  Carpal Tunnel Syndrome:  To get back to nighttime splint usage and maintain use.

## 2017-07-28 LAB — CBC WITH DIFFERENTIAL/PLATELET
BASOS: 1 %
Basophils Absolute: 0 10*3/uL (ref 0.0–0.2)
EOS (ABSOLUTE): 0.2 10*3/uL (ref 0.0–0.4)
EOS: 3 %
HEMATOCRIT: 36.2 % (ref 34.0–46.6)
Hemoglobin: 12.2 g/dL (ref 11.1–15.9)
IMMATURE GRANS (ABS): 0 10*3/uL (ref 0.0–0.1)
Immature Granulocytes: 0 %
Lymphocytes Absolute: 2.8 10*3/uL (ref 0.7–3.1)
Lymphs: 42 %
MCH: 30.5 pg (ref 26.6–33.0)
MCHC: 33.7 g/dL (ref 31.5–35.7)
MCV: 91 fL (ref 79–97)
MONOS ABS: 0.6 10*3/uL (ref 0.1–0.9)
Monocytes: 8 %
NEUTROS ABS: 3 10*3/uL (ref 1.4–7.0)
Neutrophils: 46 %
Platelets: 285 10*3/uL (ref 150–450)
RBC: 4 x10E6/uL (ref 3.77–5.28)
RDW: 13.5 % (ref 12.3–15.4)
WBC: 6.6 10*3/uL (ref 3.4–10.8)

## 2017-07-28 LAB — COMPREHENSIVE METABOLIC PANEL
A/G RATIO: 1.6 (ref 1.2–2.2)
ALK PHOS: 58 IU/L (ref 39–117)
ALT: 6 IU/L (ref 0–32)
AST: 17 IU/L (ref 0–40)
Albumin: 4.7 g/dL (ref 3.6–4.8)
BILIRUBIN TOTAL: 0.3 mg/dL (ref 0.0–1.2)
BUN/Creatinine Ratio: 18 (ref 12–28)
BUN: 11 mg/dL (ref 8–27)
CALCIUM: 9.7 mg/dL (ref 8.7–10.3)
CO2: 22 mmol/L (ref 20–29)
Chloride: 102 mmol/L (ref 96–106)
Creatinine, Ser: 0.61 mg/dL (ref 0.57–1.00)
GFR calc Af Amer: 113 mL/min/{1.73_m2} (ref >59)
GFR calc non Af Amer: 98 mL/min/{1.73_m2} (ref >59)
GLOBULIN, TOTAL: 2.9 g/dL (ref 1.5–4.5)
Glucose: 91 mg/dL (ref 65–99)
POTASSIUM: 4.4 mmol/L (ref 3.5–5.2)
SODIUM: 141 mmol/L (ref 134–144)
Total Protein: 7.6 g/dL (ref 6.0–8.5)

## 2017-07-28 LAB — HEPATITIS C ANTIBODY: Hep C Virus Ab: <0.1 s/co ratio (ref 0.0–0.9)

## 2017-07-28 LAB — TSH: TSH: 1.99 u[IU]/mL (ref 0.450–4.500)

## 2017-07-28 LAB — HIV ANTIBODY (ROUTINE TESTING W REFLEX): HIV Screen 4th Generation wRfx: NONREACTIVE

## 2017-08-11 ENCOUNTER — Encounter: Payer: Self-pay | Admitting: Gastroenterology

## 2017-08-18 ENCOUNTER — Encounter: Payer: Self-pay | Admitting: Gastroenterology

## 2017-08-18 ENCOUNTER — Other Ambulatory Visit: Payer: Self-pay

## 2017-08-18 ENCOUNTER — Ambulatory Visit (AMBULATORY_SURGERY_CENTER): Payer: Self-pay

## 2017-08-18 VITALS — Ht 62.0 in | Wt 144.0 lb

## 2017-08-18 DIAGNOSIS — Z1211 Encounter for screening for malignant neoplasm of colon: Secondary | ICD-10-CM

## 2017-08-18 MED ORDER — PEG 3350-KCL-NA BICARB-NACL 420 G PO SOLR: 4000.0000 mL | Freq: Once | ORAL | 0 refills | Status: AC

## 2017-08-18 NOTE — Progress Notes (Signed)
Denies allergies to eggs or soy products. Denies complication of anesthesia or sedation. Denies use of weight loss medication. Denies use of O2.   Emmi instructions declined.   Pre-Visit was conducted with Sonda Primes from [facility].

## 2017-09-01 ENCOUNTER — Ambulatory Visit (AMBULATORY_SURGERY_CENTER): Payer: BLUE CROSS/BLUE SHIELD | Admitting: Gastroenterology

## 2017-09-01 ENCOUNTER — Encounter: Payer: Self-pay | Admitting: Gastroenterology

## 2017-09-01 VITALS — BP 135/58 | HR 69 | Temp 99.3°F | Resp 12 | Ht <= 58 in | Wt 142.0 lb

## 2017-09-01 DIAGNOSIS — D122 Benign neoplasm of ascending colon: Secondary | ICD-10-CM | POA: Diagnosis not present

## 2017-09-01 DIAGNOSIS — D123 Benign neoplasm of transverse colon: Secondary | ICD-10-CM

## 2017-09-01 DIAGNOSIS — Z1211 Encounter for screening for malignant neoplasm of colon: Secondary | ICD-10-CM

## 2017-09-01 DIAGNOSIS — D124 Benign neoplasm of descending colon: Secondary | ICD-10-CM

## 2017-09-01 MED ORDER — SODIUM CHLORIDE 0.9 % IV SOLN: 500.0000 mL | Freq: Once | INTRAVENOUS | Status: DC

## 2017-09-01 NOTE — Op Note (Signed)
[facility] Patient Name: Elizabeth Ruiz Procedure Date: 09/01/2017 8:39 AM MRN: [id number] Endoscopist: Ladene Artist , MD Age: 62 Referring MD:  Date of Birth: [birth year] Gender: Female Account #: [id number] Procedure:                Colonoscopy Indications:              Screening for colorectal malignant neoplasm Medicines:                Monitored Anesthesia Care Procedure:                Pre-Anesthesia Assessment:                           - Prior to the procedure, a History and Physical                            was performed, and patient medications and                            allergies were reviewed. The patient's tolerance of                            previous anesthesia was also reviewed. The risks                            and benefits of the procedure and the sedation                            options and risks were discussed with the patient.                            All questions were answered, and informed consent                            was obtained. Prior Anticoagulants: The patient has                            taken no previous anticoagulant or antiplatelet                            agents. ASA Grade Assessment: II - A patient with                            mild systemic disease. After reviewing the risks                            and benefits, the patient was deemed in                            satisfactory condition to undergo the procedure.                           After obtaining informed consent, the colonoscope  was passed under direct vision. Throughout the                            procedure, the patient's blood pressure, pulse, and                            oxygen saturations were monitored continuously. The                            Colonoscope was introduced through the anus and                            advanced to the the cecum, identified by                            appendiceal orifice and  ileocecal valve. The                            ileocecal valve, appendiceal orifice, and rectum                            were photographed. The quality of the bowel                            preparation was excellent. The colonoscopy was                            performed without difficulty. The patient tolerated                            the procedure well. Scope In: 8:47:45 AM Scope Out: 9:00:59 AM Scope Withdrawal Time: 0 hours 11 minutes 12 seconds  Total Procedure Duration: 0 hours 13 minutes 14 seconds  Findings:                 The perianal and digital rectal examinations were                            normal.                           Four sessile polyps were found in the descending                            colon (2), transverse colon (1) and ascending colon                            (1). The polyps were 6 to 9 mm in size. These                            polyps were removed with a cold snare. Resection                            and retrieval were complete.  The exam was otherwise without abnormality on                            direct and retroflexion views. Complications:            No immediate complications. Estimated blood loss:                            None. Estimated Blood Loss:     Estimated blood loss: none. Impression:               - Four 6 to 9 mm polyps in the descending colon, in                            the transverse colon and in the ascending colon,                            removed with a cold snare. Resected and retrieved.                           - The examination was otherwise normal on direct                            and retroflexion views. Recommendation:           - Repeat colonoscopy in 3 - 5 years for                            surveillance pending pathology review.                           - Patient has a contact number available for                            emergencies. The signs and symptoms of  potential                            delayed complications were discussed with the                            patient. Return to normal activities tomorrow.                            Written discharge instructions were provided to the                            patient.                           - Resume previous diet.                           - Continue present medications.                           - Await pathology results. Ladene Artist, MD 09/01/2017 9:03:53  AM This report has been signed electronically.

## 2017-09-01 NOTE — Progress Notes (Signed)
Pt's states no medical or surgical changes since previsit or office visit. 

## 2017-09-01 NOTE — Progress Notes (Signed)
Called to room to assist during endoscopic procedure.  Patient ID and intended procedure confirmed with present staff. Received instructions for my participation in the procedure from the performing physician.  

## 2017-09-01 NOTE — Patient Instructions (Signed)
*   HANDDOUT ON POLYPS GIVEN*  YOU HAD AN ENDOSCOPIC PROCEDURE TODAY AT [facility]:   Refer to the procedure report that was given to you for any specific questions about what was found during the examination.  If the procedure report does not answer your questions, please call your gastroenterologist to clarify.  If you requested that your care partner not be given the details of your procedure findings, then the procedure report has been included in a sealed envelope for you to review at your convenience later.  YOU SHOULD EXPECT: Some feelings of bloating in the abdomen. Passage of more gas than usual.  Walking can help get rid of the air that was put into your GI tract during the procedure and reduce the bloating. If you had a lower endoscopy (such as a colonoscopy or flexible sigmoidoscopy) you may notice spotting of blood in your stool or on the toilet paper. If you underwent a bowel prep for your procedure, you may not have a normal bowel movement for a few days.  Please Note:  You might notice some irritation and congestion in your nose or some drainage.  This is from the oxygen used during your procedure.  There is no need for concern and it should clear up in a day or so.  SYMPTOMS TO REPORT IMMEDIATELY:   Following lower endoscopy (colonoscopy or flexible sigmoidoscopy):  Excessive amounts of blood in the stool  Significant tenderness or worsening of abdominal pains  Swelling of the abdomen that is new, acute  Fever of 100F or higher   For urgent or emergent issues, a gastroenterologist can be reached at any hour by calling [phone]  DIET:  We do recommend a small meal at first, but then you may proceed to your regular diet.  Drink plenty of fluids but you should avoid alcoholic beverages for 24 hours.  ACTIVITY:  You should plan to take it easy for the rest of today and you should NOT DRIVE or use heavy machinery until tomorrow (because of the sedation  medicines used during the test).    FOLLOW UP: Our staff will call the number listed on your records the next business day following your procedure to check on you and address any questions or concerns that you may have regarding the information given to you following your procedure. If we do not reach you, we will leave a message.  However, if you are feeling well and you are not experiencing any problems, there is no need to return our call.  We will assume that you have returned to your regular daily activities without incident.  If any biopsies were taken you will be contacted by phone or by letter within the next 1-3 weeks.  Please call us at [phone] if you have not heard about the biopsies in 3 weeks.    SIGNATURES/CONFIDENTIALITY: You and/or your care partner have signed paperwork which will be entered into your electronic medical record.  These signatures attest to the fact that that the information above on your After Visit Summary has been reviewed and is understood.  Full responsibility of the confidentiality of this discharge information lies with you and/or your care-partner.

## 2017-09-01 NOTE — Progress Notes (Signed)
Report given to PACU, vss 

## 2017-09-02 ENCOUNTER — Telehealth: Payer: Self-pay

## 2017-09-02 NOTE — Telephone Encounter (Signed)
  Follow up Call-  Call Elizabeth Ruiz number 09/01/2017  Post procedure Call Elizabeth Ruiz phone  # [phone]  Permission to leave phone message Yes  Some recent data might be hidden     Patient questions:  Do you have a fever, pain , or abdominal swelling? No. Pain Score  0 *  Have you tolerated food without any problems? Yes.    Have you been able to return to your normal activities? Yes.    Do you have any questions about your discharge instructions: Diet   No. Medications  No. Follow up visit  No.  Do you have questions or concerns about your Care? No.  Actions: * If pain score is 4 or above: No action needed, pain <4.

## 2017-09-09 ENCOUNTER — Telehealth: Payer: Self-pay | Admitting: Gastroenterology

## 2017-09-09 NOTE — Telephone Encounter (Signed)
Requesting path report.  Please advise.  Thank you.

## 2017-09-11 ENCOUNTER — Encounter: Payer: Self-pay | Admitting: Gastroenterology

## 2017-09-11 NOTE — Telephone Encounter (Signed)
See pathology letter.

## 2017-09-13 NOTE — Telephone Encounter (Signed)
Left a message for patient to call back. 

## 2017-09-14 ENCOUNTER — Telehealth: Payer: Self-pay | Admitting: Gastroenterology

## 2017-09-14 NOTE — Telephone Encounter (Signed)
Left a message for patient to call back. 

## 2017-09-14 NOTE — Telephone Encounter (Signed)
Pt returned your call and would like a call back .. °

## 2017-09-14 NOTE — Telephone Encounter (Signed)
Patient's husband showed up to the 4 th floor requesting pathology results for his wife. He states someone called him from our office and he was unable to speak to them and it's been hours. Result letter printed and given to him for his wife at this time. Patient's husband had no further questions for me.

## 2017-10-28 ENCOUNTER — Encounter: Payer: Self-pay | Admitting: Internal Medicine

## 2017-10-28 ENCOUNTER — Ambulatory Visit: Payer: BLUE CROSS/BLUE SHIELD | Admitting: Internal Medicine

## 2017-10-28 VITALS — BP 122/78 | HR 62 | Resp 12 | Ht <= 58 in | Wt 142.0 lb

## 2017-10-28 DIAGNOSIS — G5601 Carpal tunnel syndrome, right upper limb: Secondary | ICD-10-CM

## 2017-10-28 DIAGNOSIS — R5383 Other fatigue: Secondary | ICD-10-CM | POA: Diagnosis not present

## 2017-10-28 DIAGNOSIS — Z5189 Encounter for other specified aftercare: Secondary | ICD-10-CM | POA: Insufficient documentation

## 2017-10-28 DIAGNOSIS — G5621 Lesion of ulnar nerve, right upper limb: Secondary | ICD-10-CM

## 2017-10-28 DIAGNOSIS — K219 Gastro-esophageal reflux disease without esophagitis: Secondary | ICD-10-CM

## 2017-10-28 DIAGNOSIS — M722 Plantar fascial fibromatosis: Secondary | ICD-10-CM

## 2017-10-28 DIAGNOSIS — E7800 Pure hypercholesterolemia, unspecified: Secondary | ICD-10-CM

## 2017-10-28 DIAGNOSIS — H9193 Unspecified hearing loss, bilateral: Secondary | ICD-10-CM

## 2017-10-28 NOTE — Progress Notes (Signed)
   Subjective:    Patient ID: Elizabeth Ruiz, female    DOB: 16-Apr-1955, 62 y.o.   MRN: [id number]  HPI   1.  Health Maintenance:  Had colonoscopy in July.  4 adenomatous polyps.  No dysplasia.  She will have follow up in 3 years with Dr. Fuller Plan.  2.  Chronic TM scarring, small perforation only on right with plans to placeT tube in left TM to see if improves hearing from that side.  Dr. Erik Obey.  Ended up with same ENT group we were referring to after getting hearing screen at [facility]  3.  Intermittent GERD:  Last symptoms were a week ago. She describes acid in her mouth once monthly.   History of gastric erosions with upper GI bleed in 2012 when living in her home country of [place]. Did undergo EGD then.  Was treated with Ranitidine for 2-4 months and symptoms resolved.  She states they felt the erosions were from drinking OJ with ferrous sulfate on an empty stomach.  She cannot say if she was taking NSAIDS at the time.   4.  Fatigue:  Much better.  She is enjoying job.  Now working 13 hours two days of week.  Working 2 jobs now.  She is not walking much currently.    5.  CTS and swelling in right hand, her mouse hand with work.  Typing on computer all day.  STill not wearing splints regularly at night.  Notes the swelling mainly in the morning when wakes.  6. Pain at posterior left heel.  Has had for 1 month.  Worst first thing in morning when gets out of bed.  Better after goes for walk.  7.  Hypercholesterolemia:  No real changes with physical activity.  Access to Y and pool, plus at times walks after dinner with husband.  Meds:  None  No Known Allergies  Review of Systems     Objective:   Physical Exam  NAD Lungs:  CTA CV:  RRR without murmur or rub, radial pulses normal and equal Right Hand:  No swelling, full ROM, + Tinels over median nerve at volar wrist, but not over ulnar wrist at medial epicondyle. Left foot:  Tender over plantar heel without erythema or swelling.  NT over  achilles insertion.      Assessment & Plan:  1.  Hypercholesterolemia:  Has not really made changes with lifestyle particularly with physical activity.  Will give her another 6 months to work on this before checking FLP again  2.  Fatigue:  Resolved and labs all fine in May  3.  CTS, right hand plus possible ulnar neuropathy due to compression at elbow with computer/mouse work.  To pay attention to how she is holding her arm with computer work.  Once again to wear splints at bedtime nightly.  4.  Left plantar fasciitis:  Discussed avoiding barefeet in house and hard surfaces.  Heel cups.  Went over stretches, frozen water bottle roll.  5.  Hearing loss with TM scarring:  Called Dr. Noreene Filbert office and set up for T tube placement next Tuesday.  6.  GERD:  Minimal symptoms currently.  Follow  7.  Adenomatous colon polyps:  Colonoscopy with Dr.  Fuller Plan in 3 years for follow up.  Follow up 6 months for FLP and CPE with pap following.

## 2017-12-06 ENCOUNTER — Other Ambulatory Visit: Payer: Self-pay | Admitting: Internal Medicine

## 2018-01-27 ENCOUNTER — Telehealth: Payer: Self-pay | Admitting: Internal Medicine

## 2018-01-27 DIAGNOSIS — H7192 Unspecified cholesteatoma, left ear: Secondary | ICD-10-CM

## 2018-01-27 DIAGNOSIS — H9193 Unspecified hearing loss, bilateral: Secondary | ICD-10-CM

## 2018-01-27 DIAGNOSIS — H7291 Unspecified perforation of tympanic membrane, right ear: Secondary | ICD-10-CM

## 2018-01-27 NOTE — Telephone Encounter (Signed)
Pt. Called with interpreter assistance requesting ENT referral to Dr. Jeneen Rinks Located at [facility] [address], [place] [address].  Pt. States the ENT Dr. She is currently seeing is not helping her at all and she wants to go to Dr. Jeneen Rinks.  Pt. Wants to be informed when referral is made

## 2018-02-08 NOTE — Telephone Encounter (Signed)
Spoke with Dr. Thornell Mule office. patient scheduled for 03/14/2018 @ 1:20 pm. Patient to arrive by 1 pm.

## 2018-02-08 NOTE — Telephone Encounter (Signed)
Spoke with husband. Informed of patient appointment. Husband verbalized understanding

## 2018-02-08 NOTE — Telephone Encounter (Signed)
Referral and referral note completed Notify patient

## 2018-03-20 ENCOUNTER — Other Ambulatory Visit: Payer: Self-pay | Admitting: Otolaryngology

## 2018-03-20 DIAGNOSIS — H7201 Central perforation of tympanic membrane, right ear: Secondary | ICD-10-CM

## 2018-03-20 DIAGNOSIS — H906 Mixed conductive and sensorineural hearing loss, bilateral: Secondary | ICD-10-CM

## 2018-03-24 ENCOUNTER — Ambulatory Visit
Admission: RE | Admit: 2018-03-24 | Discharge: 2018-03-24 | Disposition: A | Discharge status: Home / Self Care | Payer: BLUE CROSS/BLUE SHIELD | Source: Ambulatory Visit | Attending: Otolaryngology | Admitting: Otolaryngology

## 2018-03-24 DIAGNOSIS — H906 Mixed conductive and sensorineural hearing loss, bilateral: Secondary | ICD-10-CM

## 2018-03-24 DIAGNOSIS — H7201 Central perforation of tympanic membrane, right ear: Secondary | ICD-10-CM

## 2018-03-24 MED ORDER — IOPAMIDOL (ISOVUE-300) INJECTION 61%
75.0000 mL | Freq: Once | INTRAVENOUS | Status: AC | PRN
  Administered 2018-03-24: 75 mL via INTRAVENOUS

## 2018-04-27 ENCOUNTER — Other Ambulatory Visit (INDEPENDENT_AMBULATORY_CARE_PROVIDER_SITE_OTHER): Payer: BLUE CROSS/BLUE SHIELD

## 2018-04-27 DIAGNOSIS — E7800 Pure hypercholesterolemia, unspecified: Secondary | ICD-10-CM | POA: Diagnosis not present

## 2018-04-28 LAB — LIPID PANEL W/O CHOL/HDL RATIO
CHOLESTEROL TOTAL: 253 mg/dL — AB (ref 100–199)
HDL: 71 mg/dL (ref >39)
LDL Calculated: 161 mg/dL — ABNORMAL HIGH (ref 0–99)
TRIGLYCERIDES: 107 mg/dL (ref 0–149)
VLDL Cholesterol Cal: 21 mg/dL (ref 5–40)

## 2018-05-02 ENCOUNTER — Encounter: Payer: BLUE CROSS/BLUE SHIELD | Admitting: Internal Medicine

## 2018-05-04 ENCOUNTER — Ambulatory Visit: Payer: BLUE CROSS/BLUE SHIELD | Admitting: Internal Medicine

## 2018-05-04 ENCOUNTER — Encounter: Payer: Self-pay | Admitting: Internal Medicine

## 2018-05-04 VITALS — BP 110/74 | HR 78 | Resp 12 | Ht <= 58 in | Wt 144.5 lb

## 2018-05-04 DIAGNOSIS — H7291 Unspecified perforation of tympanic membrane, right ear: Secondary | ICD-10-CM

## 2018-05-04 DIAGNOSIS — H7192 Unspecified cholesteatoma, left ear: Secondary | ICD-10-CM

## 2018-05-04 DIAGNOSIS — E7800 Pure hypercholesterolemia, unspecified: Secondary | ICD-10-CM | POA: Diagnosis not present

## 2018-05-04 DIAGNOSIS — Z1239 Encounter for other screening for malignant neoplasm of breast: Secondary | ICD-10-CM

## 2018-05-04 DIAGNOSIS — Z01811 Encounter for preprocedural respiratory examination: Secondary | ICD-10-CM

## 2018-05-04 DIAGNOSIS — Z0000 Encounter for general adult medical examination without abnormal findings: Secondary | ICD-10-CM

## 2018-05-04 NOTE — Progress Notes (Signed)
Subjective:    Patient ID: Elizabeth Ruiz, female   DOB: 1955-05-23, 63 y.o.   MRN: [id number]   HPI   CPE with pap  1.  Pap:  Last 2017 and normal.  Always normal.  No family history of cervical cancer.   2.  Mammogram:  Last mammogram normal in 2018.  She did not realize she should have this yearly.  No family history of breast cancer.    3.  Osteoprevention:  She eats 2-3 servings of dairy daily.    4.  Guaiac Cards:  Negative in 2018.    5.  Colonoscopy:  Colonoscopy June 2019 with findings of 3 adenomas and serrated sessile polyp without dysplasia.  Plans for repeat in 2022.  No family history of colon cancer.  6.  Immunizations:  Would like Herpes Zoster vaccination.  Discussed checking insurance coverage and her favorite pharmacy for charges there or the [place].  7.  Glucose/Cholesterol:  Fasting glucose has been under 100 in past.  Her cholesterol is high with a high HDL, but LDL into 160s now.  She has not been as physically active over the cold months Lipid Panel     Component Value Date/Time   CHOL 253 (H) 04/27/2018 0914   TRIG 107 04/27/2018 0914   HDL 71 04/27/2018 0914   LDLCALC 161 (H) 04/27/2018 0914    No outpatient medications have been marked as taking for the 05/04/18 encounter (Office Visit) with Mack Hook, MD.   Current Facility-Administered Medications for the 05/04/18 encounter (Office Visit) with Mack Hook, MD  Medication  . 0.9 %  sodium chloride infusion   No Known Allergies   Past Medical History:  Diagnosis Date  . Anemia   . Arthritis   . Blood transfusion without reported diagnosis    UGI bleed in 2012  . GERD (gastroesophageal reflux disease)    2012:  ?[place]:  had gastric erosions diagnosed with EGD following UGI bleed.  Resolved with course of Ranitidine  . History of anemia 08/23/2016  . Hyperlipidemia 08/23/2016  . Leg cramps   . Tinea versicolor 2016  . Upper GI bleed    NSAID provoked gastritis    Past  Surgical History:  Procedure Laterality Date  . BREAST SURGERY Right 2009   Brease Biopsy with benign Fibroma resulting    Family History  Problem Relation Age of Onset  . Diabetes Mellitus II Mother   . Hypercholesterolemia Father   . Diabetes Mellitus II Sister   . Stomach cancer Maternal Grandmother     Social History   Socioeconomic History  . Marital status: Married    Spouse name: Gerje  . Number of children: 0  . Years of education: Not on file  . Highest education level: Not on file  Occupational History  . 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 Financial resource strain: Not on file  . Food insecurity:    Worry: Never true    Inability: Never true  . Transportation needs:    Medical: No    Non-medical: No  Tobacco Use  . Smoking status: Never Smoker  . Smokeless tobacco: Never Used  Substance and Sexual Activity  . Alcohol use: Yes    Alcohol/week: 3.0 - 4.0 standard drinks    Types: 3 - 4 Glasses of wine per week  . Drug use: No  . Sexual activity: Yes  Lifestyle  . Physical activity:  Days per week: 5 days    Minutes per session: 10 min  . Stress: Not on file  Relationships  . Social connections:    Talks on phone: Not on file    Gets together: Not on file    Attends religious service: Not on file    Active member of club or organization: Not on file    Attends meetings of clubs or organizations: Not on file    Relationship status: Not on file  . Intimate partner violence:    Fear of current or ex partner: No    Emotionally abused: No    Physically abused: No    Forced sexual activity: No  Other Topics Concern  . Not on file  Social History Narrative   [place] with [place]   Originally from [place]   Lives with sister, Reesa Chew      Review of Systems  Constitutional: Negative for appetite change and fatigue.  HENT: Positive for hearing loss (Tinnitus and  decreased hearing in left ear.  Has surgery possibly, but she is not certain about having it done if not a guarantee that the noise she hears will go away.) and tinnitus. Negative for dental problem, rhinorrhea and sore throat.   Eyes: Positive for visual disturbance (Myopia).  Respiratory: Negative for cough and shortness of breath.   Cardiovascular: Negative for chest pain, palpitations and leg swelling.  Gastrointestinal: Negative for abdominal pain, blood in stool (No melena), constipation and diarrhea.  Genitourinary: Negative for dysuria and frequency.  Musculoskeletal: Negative for arthralgias.  Skin: Negative for rash.  Neurological: Positive for numbness (Has in bilateral hands and arms when works with hands that day.  Does a lot of computer work).  Psychiatric/Behavioral: Negative for dysphoric mood. The patient is not nervous/anxious.       Objective:   BP 110/74 (BP Location: Left Arm, Patient Position: Sitting, Cuff Size: Normal)   Pulse 78   Resp 12   Ht 4\' 9"  (1.448 m)   Wt 144 lb 8 oz (65.5 kg)   BMI 31.27 kg/m   Physical Exam  Constitutional: She is oriented to person, place, and time. She appears well-developed and well-nourished.  HENT:  Head: Normocephalic and atraumatic.  Right Ear: External ear and ear canal normal.  Left Ear: External ear and ear canal normal.  Nose: Nose normal.  Mouth/Throat: Uvula is midline and oropharynx is clear and moist.  Left TM thickened and pink with T tube intact. Unable to see perforation in right TM today.  Eyes: Pupils are equal, round, and reactive to light. Conjunctivae and EOM are normal.  Left upper lid twitching--baseline. Discs sharp bilaterally   Neck: Normal range of motion and full passive range of motion without pain. Neck supple. No thyromegaly present.  Cardiovascular: Normal rate, regular rhythm, S1 normal and S2 normal. Exam reveals no S3, no S4 and no friction rub.  No murmur heard. No carotid bruits.   Carotid, radial, femoral, DP and PT pulses normal and equal.   Pulmonary/Chest: Effort normal and breath sounds normal. Right breast exhibits no inverted nipple, no mass, no nipple discharge and no skin change. Left breast exhibits no inverted nipple, no mass, no nipple discharge and no skin change.  Abdominal: Soft. Bowel sounds are normal. She exhibits no mass. There is no hepatosplenomegaly. There is no abdominal tenderness. No hernia.  Genitourinary:    Genitourinary Comments: Normal external female genitalia.   No cervical or vaginal lesions. No uterine  or adnexal mass or tenderness.   Musculoskeletal: Normal range of motion.  Lymphadenopathy:       Head (right side): No submental and no submandibular adenopathy present.       Head (left side): No submental and no submandibular adenopathy present.    She has no cervical adenopathy.    She has no axillary adenopathy.       Right: No inguinal and no supraclavicular adenopathy present.       Left: No inguinal and no supraclavicular adenopathy present.  Neurological: She is alert and oriented to person, place, and time. She has normal strength and normal reflexes. No cranial nerve deficit or sensory deficit. Coordination and gait normal.  Skin: Skin is warm. Lesion (numerous nevi of differing shapes and shades of brown.  Particularly prolific on back.  All well circumscribed.  ) noted. No rash noted.  Psychiatric: Her speech is normal and behavior is normal. Judgment and thought content normal. Her mood appears anxious. Cognition and memory are normal.     Assessment & Plan  1.  CPE without pap. Mammogram to be scheduled.  2.  Multiple Nevi:  Refuses derm referral for regular full skin scan with all her nevi, particularly on back  3.  Bilateral chronic issues with ears:  Will need to call Dr. Thornell Mule to find out what the plan is regarding her ears.   She has papers for preop, but not clear if the surgery is actually set up yet.   Preop  CXR Return tomorrow am for ECG and labs fasting:  CBC, CMP, PT, PTT  at 8:20

## 2018-05-04 NOTE — Patient Instructions (Addendum)
For immunizations at [facility]:  Children and Adults: [place] [employer]: [phone] (Information in [place] available)  [place] [address] floor [address], [address] [place], [place] [address]   Can google "advance directives, [place]"  And bring up form from Secretary of [place]. Print and fill out Or can go to "5 wishes"  Which is also in Spanish and fill out--this costs $5--perhaps easier to use. Designate a Medical Power of Attorney to speak for you if you are unable to speak for yourself when ill or injured

## 2018-05-05 ENCOUNTER — Other Ambulatory Visit (INDEPENDENT_AMBULATORY_CARE_PROVIDER_SITE_OTHER): Payer: BLUE CROSS/BLUE SHIELD

## 2018-05-05 ENCOUNTER — Ambulatory Visit
Admission: RE | Admit: 2018-05-05 | Discharge: 2018-05-05 | Disposition: A | Discharge status: Home / Self Care | Payer: BLUE CROSS/BLUE SHIELD | Source: Ambulatory Visit | Attending: Internal Medicine | Admitting: Internal Medicine

## 2018-05-05 DIAGNOSIS — Z01811 Encounter for preprocedural respiratory examination: Secondary | ICD-10-CM

## 2018-05-05 DIAGNOSIS — Z01810 Encounter for preprocedural cardiovascular examination: Secondary | ICD-10-CM | POA: Diagnosis not present

## 2018-05-05 NOTE — Addendum Note (Signed)
Addended by: Serafina Mitchell on: 05/05/2018 03:24 PM   Modules accepted: Orders

## 2018-05-06 LAB — CBC WITH DIFFERENTIAL/PLATELET
BASOS: 1 %
Basophils Absolute: 0 10*3/uL (ref 0.0–0.2)
EOS (ABSOLUTE): 0.1 10*3/uL (ref 0.0–0.4)
Eos: 2 %
HEMOGLOBIN: 12.2 g/dL (ref 11.1–15.9)
Hematocrit: 36.7 % (ref 34.0–46.6)
Immature Grans (Abs): 0 10*3/uL (ref 0.0–0.1)
Immature Granulocytes: 0 %
Lymphocytes Absolute: 2.3 10*3/uL (ref 0.7–3.1)
Lymphs: 42 %
MCH: 30.9 pg (ref 26.6–33.0)
MCHC: 33.2 g/dL (ref 31.5–35.7)
MCV: 93 fL (ref 79–97)
Monocytes Absolute: 0.4 10*3/uL (ref 0.1–0.9)
Monocytes: 8 %
NEUTROS ABS: 2.6 10*3/uL (ref 1.4–7.0)
Neutrophils: 47 %
Platelets: 274 10*3/uL (ref 150–450)
RBC: 3.95 x10E6/uL (ref 3.77–5.28)
RDW: 11.9 % (ref 11.7–15.4)
WBC: 5.5 10*3/uL (ref 3.4–10.8)

## 2018-05-06 LAB — COMPREHENSIVE METABOLIC PANEL
ALT: 7 IU/L (ref 0–32)
AST: 16 IU/L (ref 0–40)
Albumin/Globulin Ratio: 1.9 (ref 1.2–2.2)
Albumin: 4.7 g/dL (ref 3.8–4.8)
Alkaline Phosphatase: 54 IU/L (ref 39–117)
BUN/Creatinine Ratio: 18 (ref 12–28)
BUN: 13 mg/dL (ref 8–27)
Bilirubin Total: 0.4 mg/dL (ref 0.0–1.2)
CO2: 24 mmol/L (ref 20–29)
CREATININE: 0.73 mg/dL (ref 0.57–1.00)
Calcium: 9.3 mg/dL (ref 8.7–10.3)
Chloride: 98 mmol/L (ref 96–106)
GFR calc non Af Amer: 89 mL/min/{1.73_m2} (ref >59)
GFR, EST AFRICAN AMERICAN: 102 mL/min/{1.73_m2} (ref >59)
Globulin, Total: 2.5 g/dL (ref 1.5–4.5)
Glucose: 91 mg/dL (ref 65–99)
Potassium: 4.2 mmol/L (ref 3.5–5.2)
Sodium: 137 mmol/L (ref 134–144)
Total Protein: 7.2 g/dL (ref 6.0–8.5)

## 2018-05-06 LAB — APTT: aPTT: 24 s (ref 24–33)

## 2018-05-06 LAB — PROTIME-INR
INR: 1 (ref 0.8–1.2)
Prothrombin Time: 10.4 s (ref 9.1–12.0)

## 2018-05-10 ENCOUNTER — Telehealth: Payer: Self-pay | Admitting: Internal Medicine

## 2018-05-10 NOTE — Telephone Encounter (Signed)
Spoke with Dr. Thornell Mule last week:  He does feel she will get relief from the noise she hears from her left ear with the surgery.   He is trying to get her in as soon as possible for the surgery at this point, but due to the timing, may be a delay as he gets organized at [facility].

## 2018-05-11 NOTE — Telephone Encounter (Signed)
Spoke with husband. Informed of message and he verbalized understanding

## 2018-05-14 ENCOUNTER — Encounter: Payer: Self-pay | Admitting: Internal Medicine

## 2019-03-27 HISTORY — PX: OTHER SURGICAL HISTORY: SHX169

## 2019-07-12 ENCOUNTER — Encounter: Payer: BLUE CROSS/BLUE SHIELD | Admitting: Internal Medicine

## 2019-08-30 ENCOUNTER — Encounter: Payer: Self-pay | Admitting: Internal Medicine

## 2019-08-30 ENCOUNTER — Other Ambulatory Visit: Payer: Self-pay | Admitting: Internal Medicine

## 2019-08-30 ENCOUNTER — Other Ambulatory Visit: Payer: Self-pay

## 2019-08-30 ENCOUNTER — Ambulatory Visit (INDEPENDENT_AMBULATORY_CARE_PROVIDER_SITE_OTHER): Payer: 59 | Admitting: Internal Medicine

## 2019-08-30 VITALS — BP 126/70 | HR 78 | Resp 12 | Ht <= 58 in | Wt 144.0 lb

## 2019-08-30 DIAGNOSIS — Z1231 Encounter for screening mammogram for malignant neoplasm of breast: Secondary | ICD-10-CM

## 2019-08-30 DIAGNOSIS — Z0000 Encounter for general adult medical examination without abnormal findings: Secondary | ICD-10-CM

## 2019-08-30 DIAGNOSIS — E7800 Pure hypercholesterolemia, unspecified: Secondary | ICD-10-CM

## 2019-08-30 DIAGNOSIS — Z124 Encounter for screening for malignant neoplasm of cervix: Secondary | ICD-10-CM | POA: Diagnosis not present

## 2019-08-30 DIAGNOSIS — Z780 Asymptomatic menopausal state: Secondary | ICD-10-CM

## 2019-08-30 DIAGNOSIS — G5601 Carpal tunnel syndrome, right upper limb: Secondary | ICD-10-CM

## 2019-08-30 NOTE — Patient Instructions (Signed)
Recommend Shingles vaccine at your favorite pharmacy.

## 2019-08-30 NOTE — Progress Notes (Signed)
Subjective:    Patient ID: Elizabeth Ruiz, female   DOB: 1955-09-26, 64 y.o.   MRN: [id number]   HPI   CPE with pap  1.  Pap:  Last in 09/2015 and normal.    2.  Mammogram:  Last 2018 and normal.  Got behind with pandemic.  3.  Osteoprevention:  Describes adequate dairy intake.  Does occasionally take a Vitamin D supplement.    4.  Guaiac Cards:  Last done in 2018 and negative.  5.  Colonoscopy:  Last checked 08/2017 with 4 adenomas without dysplasia.  To have repeat next year with Dr. Fuller Plan, GI .   6.  Immunizations:  Immunization History  Administered Date(s) Administered  . Influenza,inj,Quad PF,6+ Mos 12/26/2017  . Influenza-Unspecified 11/23/2014, 12/22/2017  . Moderna SARS-COVID-2 Vaccination 05/07/2019, 06/12/2019     7.  Glucose/Cholesterol:  History of elevated cholesterol with high HDL and LDL.   Lipid Panel     Component Value Date/Time   CHOL 253 (H) 04/27/2018 0914   TRIG 107 04/27/2018 0914   HDL 71 04/27/2018 0914   LDLCALC 161 (H) 04/27/2018 0914   LABVLDL 21 04/27/2018 0914     Current Meds  Medication Sig  . aspirin 81 MG EC tablet Take 81 mg by mouth daily.    Current Facility-Administered Medications for the 08/30/19 encounter (Office Visit) with Mack Hook, MD  Medication  . 0.9 %  sodium chloride infusion   No Known Allergies   Past Medical History:  Diagnosis Date  . Anemia   . Arthritis   . Blood transfusion without reported diagnosis    UGI bleed in 2012  . GERD (gastroesophageal reflux disease)    2012:  ?[place]:  had gastric erosions diagnosed with EGD following UGI bleed.  Resolved with course of Ranitidine  . History of anemia 08/23/2016  . Hyperlipidemia 08/23/2016  . Leg cramps   . Tinea versicolor 2016  . Upper GI bleed    NSAID provoked gastritis    Past Surgical History:  Procedure Laterality Date  . BREAST SURGERY Right 2009   Brease Biopsy with benign Fibroma resulting  . left ear surgery Left 03/27/2019    Dr. Vicie Mutters, ENT   Family History  Problem Relation Age of Onset  . Diabetes Mellitus II Mother   . Hypercholesterolemia Father   . Diabetes Mellitus II Sister   . Stomach cancer Maternal Grandmother     Social History   Socioeconomic History  . Marital status: Married    Spouse name: Gerje  . Number of children: 0  . Years of education: Not on file  . Highest education level: Not on file  Occupational History  . 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 Smoking status: Never Smoker  . Smokeless tobacco: Never Used  Vaping Use  . Vaping Use: Never used  Substance and Sexual Activity  . Alcohol use: Yes    Alcohol/week: 3.0 - 4.0 standard drinks    Types: 3 - 4 Glasses of wine per week  . Drug use: No  . Sexual activity: Yes  Other Topics Concern  . Not on file  Social History Narrative   [place] with [place]   Originally from [place]   Lives with sister, Advertising copywriter   Social Determinants of Health   Financial Resource Strain:   . Difficulty of Paying Living Expenses:   Food Insecurity:   . Worried  About Running Out of Food in the Last Year:   . [place] in the Last Year:   Transportation Needs:   . Lack of Transportation (Medical):   Marland Kitchen Lack of Transportation (Non-Medical):   Physical Activity:   . Days of Exercise per Week:   . Minutes of Exercise per Session:   Stress:   . Feeling of Stress :   Social Connections:   . Frequency of Communication with Friends and Family:   . Frequency of Social Gatherings with Friends and Family:   . Attends Religious Services:   . Active Member of Clubs or Organizations:   . Attends Archivist Meetings:   Marland Kitchen Marital Status:   Intimate Partner Violence:   . Fear of Current or Ex-Partner:   . Emotionally Abused:   Marland Kitchen Physically Abused:   . Sexually Abused:       Review of Systems  Constitutional: Negative for appetite change and  fatigue.  HENT: Positive for dental problem (Has an appt with dentist next month.  Since her left ear surgery, she hs had copious salivation possibly from where would expect from her parotid gland on left.  She had tooth adjacent removed due to infection, but did not improve.  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  ). Negative for ear discharge, sneezing and sore throat.   Eyes: Positive for visual disturbance (Wears glasses.  Has appt coming up with eye doctor.  ).  Respiratory: Negative for shortness of breath.   Cardiovascular: Positive for leg swelling (Legs dependent at work all day.  ). Negative for chest pain and palpitations.  Gastrointestinal: Positive for constipation. Negative for abdominal pain, blood in stool (No melena.) and diarrhea.  Genitourinary: Negative for dysuria.  Musculoskeletal:       Swelling of hands mainly after waking in morning.  Right hand numbness with the swelling.  Has cock up splint, but not wearing.  Skin:       Hair loss--would like TSH done.  Neurological: Positive for numbness (right hand and arm at night.).  Psychiatric/Behavioral: Negative for dysphoric mood. The patient is not nervous/anxious.       Objective:   BP 126/70 (BP Location: Left Arm, Patient Position: Sitting, Cuff Size: Normal)   Pulse 78   Resp 12   Ht 4\' 9"  (1.448 m)   Wt 144 lb (65.3 kg)   BMI 31.16 kg/m   Physical Exam Constitutional:      Appearance: Normal appearance. She is obese.  HENT:     Head: Normocephalic and atraumatic.     Right Ear: Tympanic membrane, ear canal and external ear normal.     Left Ear: Ear canal and external ear normal.     Ears:     Comments: Left TM thickened, but not wet or with perforation now.  No erythema.    Nose: Nose normal.     Mouth/Throat:     Mouth: Mucous membranes are moist.     Pharynx: Oropharynx is clear.      Comments: Opening  of parotid duct in left upper buccal mucosa adjacent to tooth that has been removed without obvious swelling or discharge.  Unable to assess well copious saliva from opening. Eyes:     Extraocular Movements: Extraocular movements intact.     Conjunctiva/sclera: Conjunctivae normal.     Pupils: Pupils are equal, round, and reactive to light.  Comments: Left eye chronically more prominent.  Chronic twitching of eyelids.  Appears worse as gets anxious.  Neck:     Thyroid: No thyromegaly.  Cardiovascular:     Rate and Rhythm: Normal rate and regular rhythm.     Heart sounds: S1 normal and S2 normal. No murmur heard.  No friction rub. No S3 or S4 sounds.      Comments: No carotid bruits.  Carotid, radial, femoral, DP and PT pulses normal and equal.  Varicosities of bilateral LE throughout. Pulmonary:     Effort: Pulmonary effort is normal.     Breath sounds: Normal breath sounds.  Chest:     Breasts:        Right: No inverted nipple, mass, skin change or tenderness.        Left: No inverted nipple, mass, skin change or tenderness.     Comments: Patient declined exam Abdominal:     General: Abdomen is flat. Bowel sounds are normal.     Palpations: Abdomen is soft. There is no hepatomegaly, splenomegaly or mass.     Tenderness: There is no abdominal tenderness.     Hernia: No hernia is present.  Genitourinary:    Comments: Normal external female genitalia No cervical or vaginal lesions.   No uterine or adnexal mass or tenderness.  Musculoskeletal:        General: Normal range of motion.     Cervical back: Full passive range of motion without pain, normal range of motion and neck supple.     Right lower leg: 1+ Edema present.     Left lower leg: 1+ Edema present.  Lymphadenopathy:     Head:     Right side of head: No submental or submandibular adenopathy.     Left side of head: No submental or submandibular adenopathy.     Cervical: No cervical adenopathy.     Upper Body:      Right upper body: No supraclavicular or axillary adenopathy.     Left upper body: No supraclavicular or axillary adenopathy.     Lower Body: No right inguinal adenopathy. No left inguinal adenopathy.  Skin:    General: Skin is warm.     Capillary Refill: Capillary refill takes less than 2 seconds.     Comments: Scattered well circumscribed nevi over entire skin surface, particularly of back.  Differing shades of brown.  Neurological:     Mental Status: She is alert and oriented to person, place, and time.     Cranial Nerves: Cranial nerves are intact.     Sensory: Sensation is intact.     Motor: Motor function is intact.     Coordination: Coordination is intact.     Gait: Gait is intact.     Deep Tendon Reflexes: Reflexes are normal and symmetric.     Comments: + tinels over right median nerve  Psychiatric:        Attention and Perception: Attention and perception normal.        Mood and Affect: Mood normal.        Speech: Speech normal.        Behavior: Behavior normal.        Thought Content: Thought content normal.        Cognition and Memory: Cognition and memory normal.        Judgment: Judgment normal.      Assessment & Plan  1.  CPE Pap Mammogram ordered DEXA Bone density Guaiac cards x 3 to  return in 2 weeks. Mild BV on wet prep, but asymptomatic, so will not treat. Herpes Zoster vaccination at pharmacy recommended.  2.  Hypersalivation:  Since surgery on left ear, likely from left parotid gland.  Not clear if this could be due to surgery or not.  She is to bring this up again with Dr. Thornell Mule, ENT.  Unlikely to be able to do anything to decrease other than meds that can cause generalized mouth dryness.  3.  Right Carpal Tunnel:  Turn down heat in bedroom at night and wear splint nightly.  4.  Fasting labs:  FLP (hypercholesterolemia.), CBC, CMP, TSH (hair loss) tomorrow.  4.

## 2019-08-31 ENCOUNTER — Other Ambulatory Visit: Payer: Self-pay

## 2019-08-31 ENCOUNTER — Other Ambulatory Visit (INDEPENDENT_AMBULATORY_CARE_PROVIDER_SITE_OTHER): Payer: 59

## 2019-08-31 DIAGNOSIS — Z0000 Encounter for general adult medical examination without abnormal findings: Secondary | ICD-10-CM

## 2019-08-31 DIAGNOSIS — L659 Nonscarring hair loss, unspecified: Secondary | ICD-10-CM

## 2019-08-31 DIAGNOSIS — E7800 Pure hypercholesterolemia, unspecified: Secondary | ICD-10-CM | POA: Diagnosis not present

## 2019-08-31 LAB — POCT WET PREP WITH KOH
KOH Prep POC: NEGATIVE
RBC Wet Prep HPF POC: NEGATIVE
Trichomonas, UA: NEGATIVE
Yeast Wet Prep HPF POC: NEGATIVE

## 2019-09-01 LAB — CBC WITH DIFFERENTIAL/PLATELET
Basophils Absolute: 0 10*3/uL (ref 0.0–0.2)
Basos: 1 %
EOS (ABSOLUTE): 0.1 10*3/uL (ref 0.0–0.4)
Eos: 2 %
Hematocrit: 37 % (ref 34.0–46.6)
Hemoglobin: 12 g/dL (ref 11.1–15.9)
Immature Grans (Abs): 0 10*3/uL (ref 0.0–0.1)
Immature Granulocytes: 0 %
Lymphocytes Absolute: 2.2 10*3/uL (ref 0.7–3.1)
Lymphs: 38 %
MCH: 30.4 pg (ref 26.6–33.0)
MCHC: 32.4 g/dL (ref 31.5–35.7)
MCV: 94 fL (ref 79–97)
Monocytes Absolute: 0.6 10*3/uL (ref 0.1–0.9)
Monocytes: 10 %
Neutrophils Absolute: 2.8 10*3/uL (ref 1.4–7.0)
Neutrophils: 49 %
Platelets: 242 10*3/uL (ref 150–450)
RBC: 3.95 x10E6/uL (ref 3.77–5.28)
RDW: 12.3 % (ref 11.7–15.4)
WBC: 5.8 10*3/uL (ref 3.4–10.8)

## 2019-09-01 LAB — LIPID PANEL W/O CHOL/HDL RATIO
Cholesterol, Total: 237 mg/dL — ABNORMAL HIGH (ref 100–199)
HDL: 75 mg/dL (ref >39)
LDL Chol Calc (NIH): 151 mg/dL — ABNORMAL HIGH (ref 0–99)
Triglycerides: 64 mg/dL (ref 0–149)
VLDL Cholesterol Cal: 11 mg/dL (ref 5–40)

## 2019-09-01 LAB — COMPREHENSIVE METABOLIC PANEL
ALT: 6 IU/L (ref 0–32)
AST: 12 IU/L (ref 0–40)
Albumin/Globulin Ratio: 1.6 (ref 1.2–2.2)
Albumin: 4.4 g/dL (ref 3.8–4.8)
Alkaline Phosphatase: 61 IU/L (ref 48–121)
BUN/Creatinine Ratio: 18 (ref 12–28)
BUN: 13 mg/dL (ref 8–27)
Bilirubin Total: 0.4 mg/dL (ref 0.0–1.2)
CO2: 24 mmol/L (ref 20–29)
Calcium: 9.4 mg/dL (ref 8.7–10.3)
Chloride: 103 mmol/L (ref 96–106)
Creatinine, Ser: 0.71 mg/dL (ref 0.57–1.00)
GFR calc Af Amer: 105 mL/min/{1.73_m2} (ref >59)
GFR calc non Af Amer: 91 mL/min/{1.73_m2} (ref >59)
Globulin, Total: 2.7 g/dL (ref 1.5–4.5)
Glucose: 95 mg/dL (ref 65–99)
Potassium: 4.7 mmol/L (ref 3.5–5.2)
Sodium: 140 mmol/L (ref 134–144)
Total Protein: 7.1 g/dL (ref 6.0–8.5)

## 2019-09-01 LAB — TSH: TSH: 1.84 u[IU]/mL (ref 0.450–4.500)

## 2019-09-03 LAB — CYTOLOGY - PAP

## 2019-09-04 ENCOUNTER — Telehealth: Payer: Self-pay | Admitting: Internal Medicine

## 2019-09-20 ENCOUNTER — Other Ambulatory Visit: Payer: Self-pay

## 2019-09-20 ENCOUNTER — Ambulatory Visit
Admission: RE | Admit: 2019-09-20 | Discharge: 2019-09-20 | Disposition: A | Discharge status: Home / Self Care | Payer: BLUE CROSS/BLUE SHIELD | Source: Ambulatory Visit | Attending: Internal Medicine | Admitting: Internal Medicine

## 2019-09-20 DIAGNOSIS — Z1231 Encounter for screening mammogram for malignant neoplasm of breast: Secondary | ICD-10-CM

## 2019-09-25 NOTE — Telephone Encounter (Signed)
Spoke with patient. Results given. Patient verbalized understanding.

## 2019-11-15 ENCOUNTER — Other Ambulatory Visit: Payer: Self-pay

## 2019-11-15 ENCOUNTER — Ambulatory Visit
Admission: RE | Admit: 2019-11-15 | Discharge: 2019-11-15 | Disposition: A | Discharge status: Home / Self Care | Payer: 59 | Source: Ambulatory Visit | Attending: Internal Medicine | Admitting: Internal Medicine

## 2019-11-15 DIAGNOSIS — Z780 Asymptomatic menopausal state: Secondary | ICD-10-CM

## 2020-09-02 ENCOUNTER — Encounter: Payer: 59 | Admitting: Internal Medicine

## 2020-10-28 ENCOUNTER — Other Ambulatory Visit: Payer: Self-pay

## 2020-10-28 ENCOUNTER — Ambulatory Visit (INDEPENDENT_AMBULATORY_CARE_PROVIDER_SITE_OTHER): Payer: 59 | Admitting: Internal Medicine

## 2020-10-28 ENCOUNTER — Encounter: Payer: Self-pay | Admitting: Internal Medicine

## 2020-10-28 VITALS — BP 118/66 | HR 72 | Resp 12 | Ht <= 58 in | Wt 144.0 lb

## 2020-10-28 DIAGNOSIS — Z23 Encounter for immunization: Secondary | ICD-10-CM | POA: Diagnosis not present

## 2020-10-28 DIAGNOSIS — Z0000 Encounter for general adult medical examination without abnormal findings: Secondary | ICD-10-CM

## 2020-10-28 DIAGNOSIS — Z860101 Personal history of adenomatous and serrated colon polyps: Secondary | ICD-10-CM | POA: Insufficient documentation

## 2020-10-28 DIAGNOSIS — Z1231 Encounter for screening mammogram for malignant neoplasm of breast: Secondary | ICD-10-CM | POA: Diagnosis not present

## 2020-10-28 DIAGNOSIS — Z79899 Other long term (current) drug therapy: Secondary | ICD-10-CM

## 2020-10-28 DIAGNOSIS — M25562 Pain in left knee: Secondary | ICD-10-CM | POA: Diagnosis not present

## 2020-10-28 DIAGNOSIS — E7800 Pure hypercholesterolemia, unspecified: Secondary | ICD-10-CM

## 2020-10-28 DIAGNOSIS — Z86010 Personal history of colonic polyps: Secondary | ICD-10-CM | POA: Diagnosis not present

## 2020-10-28 NOTE — Progress Notes (Signed)
Subjective:    Patient ID: Elizabeth Ruiz, female   DOB: 1955/06/10, 65 y.o.   MRN: [id number]   HPI  CPE without pap  1.  Pap:  Last pap was 08/30/19 and normal.  Discussed now that she is 80 and with a long term stable partner, no need to repeat unless she prefers.    2.  Mammogram:  Did not schedule mammogram as did not realize this is recommended after age 44.   3.  Osteoprevention:  DEXA performed last year and normal.  Does drink milk--only one cup daily.  Eats a lot of cheese.  Walks daily.    4.  FIT:  Refuses to perform  5.  Colonoscopy:  Performed last 2019 with Dr. Fuller Plan with findings of adenomatous polyps without dysplasia.  She does not want to repeat this year as recommended.  States she will have done next year.    6.  Immunizations:  Both she and her husband, Gergji suffered from [place] in mid May.  Mild cases.    7.  Glucose/Cholesterol:  Fasting blood glucose all fine in past.  History of hypercholesterolemia.    Current Meds  Medication Sig   aspirin 81 MG EC tablet Take 81 mg by mouth daily.   Diclofenac gel as needed for left knee pain  No Known Allergies  Past Medical History:  Diagnosis Date   Anemia    Arthritis    Blood transfusion without reported diagnosis    UGI bleed in 2012   GERD (gastroesophageal reflux disease)    2012:  ?[place]:  had gastric erosions diagnosed with EGD following UGI bleed.  Resolved with course of Ranitidine   History of anemia 08/23/2016   Hyperlipidemia 08/23/2016   Leg cramps    Tinea versicolor 2016   Upper GI bleed    NSAID provoked gastritis   Past Surgical History:  Procedure Laterality Date   BREAST SURGERY Right 2009   Brease Biopsy with benign Fibroma resulting   left ear surgery Left 03/27/2019   Dr. 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 with [place]   Originally from [place]   Lives with sister, Advertising copywriter   Social Determinants of Health   Financial Resource Strain: Not on [employer] Insecurity: Not on file  Transportation Needs: Not on file  Physical Activity: Not on file  Stress: Not on file  Social Connections: Not on file  Intimate Partner Violence: Not on file  Review of Systems  Respiratory:  Negative for shortness of breath.   Cardiovascular:  Positive for leg swelling (Legs dependent all day.). Negative for chest pain and palpitations.  Musculoskeletal:  Positive for arthralgias (Left knee pain.  Walked a bit more than usual one day recently. Whole knee, including popliteal fossa hurts.  Feels swollent.  Voltaren cream takes care of the pain well.  Also, some turmeric tea.).     Objective:   BP 118/66 (BP Location: Right Arm, Patient Position: Sitting, Cuff Size: Normal)   Pulse 72   Resp 12   Ht '4\' 9"'$  (1.448 m)   Wt 144 lb (65.3 kg)   BMI 31.16 kg/m   Physical Exam HENT:     Head: Normocephalic and atraumatic.     Right Ear: Tympanic membrane, ear canal and external ear normal.     Left Ear: Ear canal and external ear normal.     Ears:     Comments: Left TM with moisture, thickening and chronic mild erythema.    Nose: Nose  normal.     Mouth/Throat:     Mouth: Mucous membranes are moist.     Pharynx: Oropharynx is clear.  Eyes:     Extraocular Movements: Extraocular movements intact.     Conjunctiva/sclera: Conjunctivae normal.     Pupils: Pupils are equal, round, and reactive to light.     Comments: Discs sharp bilaterally. Right eyelid with chronic twitching/squinting.  Neck:     Thyroid: No thyroid mass or thyromegaly.  Cardiovascular:     Rate and Rhythm: Normal rate and regular rhythm.     Heart sounds: S1 normal and S2 normal. No murmur heard.   No friction rub. No S3 or S4 sounds.     Comments: No carotid bruits.  Carotid, radial, femoral, DP and PT pulses normal and equal.   Varicosities and broken spider veins of bilateral lower extremities.   Pulmonary:     Effort: Pulmonary effort is normal.     Breath sounds: Normal breath sounds.  Chest:  Breasts:    Right: No inverted nipple, mass, nipple discharge or skin change.     Left: No inverted nipple, mass, nipple discharge or skin change.  Abdominal:     General: Bowel sounds are normal.     Palpations: Abdomen is soft. There is no hepatomegaly, splenomegaly or mass.     Tenderness: There is no abdominal tenderness.     Hernia: No hernia is present.  Genitourinary:    Comments: Normal external female genitalia No uterine or adnexal mass or tenderness. Musculoskeletal:        General: Normal range of motion.     Cervical back: Normal range of motion and neck supple.     Comments: Bilateral knees with hypertrophic change.  Left knee with tenderness along medial and lateral joint line--perhaps more specifically over collateral ligaments.   No tenderness with compression of patella against anterior joint.  No collateral or cruciate ligament laxity or pain on stress maneuvers. No effusion. Full ROM  Lymphadenopathy:     Head:     Right side of head: No submental or submandibular adenopathy.     Left side of head: No submental or  submandibular adenopathy.     Cervical: No cervical adenopathy.     Upper Body:     Right upper body: No supraclavicular or axillary adenopathy.     Left upper body: No supraclavicular or axillary adenopathy.     Lower Body: No  right inguinal adenopathy. No left inguinal adenopathy.  Skin:    General: Skin is warm.     Capillary Refill: Capillary refill takes less than 2 seconds.     Findings: No rash.  Neurological:     Mental Status: She is alert.     Cranial Nerves: Cranial nerves are intact.     Sensory: Sensation is intact.     Motor: Motor function is intact.     Coordination: Coordination is intact.     Gait: Gait is intact.     Deep Tendon Reflexes: Reflexes are normal and symmetric.  Psychiatric:        Attention and Perception: Attention normal.        Mood and Affect: Mood normal.        Speech: Speech normal.        Behavior: Behavior normal. Behavior is cooperative.     Assessment & Plan   CPE without pap Shingrix vaccine #1/2.  Repeat in 2-6 months. Refuses COVID booster, pneumococcal, tetanus, influenza vaccine recommendations. Mammogram ordered. CBC, CMP  2.  History of adenomatous colon polyp:  would like to put off follow up colonoscopy until March of 2023.  Referral sent to Dr. Lynne Leader office.  3.  Left Knee pain:  does not want Xray performed.  Would like to see how she does using tart cherry instead of turmeric and the topical Diclofenac gel.    4.  Hypercholesterolemia:  FLP

## 2020-10-29 LAB — COMPREHENSIVE METABOLIC PANEL
ALT: 6 IU/L (ref 0–32)
AST: 17 IU/L (ref 0–40)
Albumin/Globulin Ratio: 1.7 (ref 1.2–2.2)
Albumin: 4.7 g/dL (ref 3.8–4.8)
Alkaline Phosphatase: 59 IU/L (ref 44–121)
BUN/Creatinine Ratio: 15 (ref 12–28)
BUN: 10 mg/dL (ref 8–27)
Bilirubin Total: 0.5 mg/dL (ref 0.0–1.2)
CO2: 25 mmol/L (ref 20–29)
Calcium: 9.3 mg/dL (ref 8.7–10.3)
Chloride: 102 mmol/L (ref 96–106)
Creatinine, Ser: 0.65 mg/dL (ref 0.57–1.00)
Globulin, Total: 2.7 g/dL (ref 1.5–4.5)
Glucose: 92 mg/dL (ref 65–99)
Potassium: 4.1 mmol/L (ref 3.5–5.2)
Sodium: 139 mmol/L (ref 134–144)
Total Protein: 7.4 g/dL (ref 6.0–8.5)
eGFR: 98 mL/min/{1.73_m2} (ref >59)

## 2020-10-29 LAB — CBC WITH DIFFERENTIAL/PLATELET
Basophils Absolute: 0.1 10*3/uL (ref 0.0–0.2)
Basos: 1 %
EOS (ABSOLUTE): 0.1 10*3/uL (ref 0.0–0.4)
Eos: 1 %
Hematocrit: 36.6 % (ref 34.0–46.6)
Hemoglobin: 11.9 g/dL (ref 11.1–15.9)
Immature Grans (Abs): 0 10*3/uL (ref 0.0–0.1)
Immature Granulocytes: 0 %
Lymphocytes Absolute: 2.2 10*3/uL (ref 0.7–3.1)
Lymphs: 38 %
MCH: 31.3 pg (ref 26.6–33.0)
MCHC: 32.5 g/dL (ref 31.5–35.7)
MCV: 96 fL (ref 79–97)
Monocytes Absolute: 0.4 10*3/uL (ref 0.1–0.9)
Monocytes: 8 %
Neutrophils Absolute: 3 10*3/uL (ref 1.4–7.0)
Neutrophils: 52 %
Platelets: 251 10*3/uL (ref 150–450)
RBC: 3.8 x10E6/uL (ref 3.77–5.28)
RDW: 12.4 % (ref 11.7–15.4)
WBC: 5.7 10*3/uL (ref 3.4–10.8)

## 2020-10-29 LAB — LIPID PANEL W/O CHOL/HDL RATIO
Cholesterol, Total: 262 mg/dL — ABNORMAL HIGH (ref 100–199)
HDL: 75 mg/dL (ref >39)
LDL Chol Calc (NIH): 171 mg/dL — ABNORMAL HIGH (ref 0–99)
Triglycerides: 92 mg/dL (ref 0–149)
VLDL Cholesterol Cal: 16 mg/dL (ref 5–40)

## 2020-12-13 ENCOUNTER — Encounter: Payer: Self-pay | Admitting: Gastroenterology

## 2021-01-08 ENCOUNTER — Ambulatory Visit
Admission: RE | Admit: 2021-01-08 | Discharge: 2021-01-08 | Disposition: A | Discharge status: Home / Self Care | Payer: 59 | Source: Ambulatory Visit | Attending: Internal Medicine | Admitting: Internal Medicine

## 2021-01-08 DIAGNOSIS — Z1231 Encounter for screening mammogram for malignant neoplasm of breast: Secondary | ICD-10-CM

## 2021-01-09 ENCOUNTER — Other Ambulatory Visit: Payer: Self-pay | Admitting: Internal Medicine

## 2021-01-09 DIAGNOSIS — R928 Other abnormal and inconclusive findings on diagnostic imaging of breast: Secondary | ICD-10-CM

## 2021-01-21 ENCOUNTER — Other Ambulatory Visit: Payer: Self-pay

## 2021-01-21 ENCOUNTER — Ambulatory Visit (INDEPENDENT_AMBULATORY_CARE_PROVIDER_SITE_OTHER): Payer: 59 | Admitting: Internal Medicine

## 2021-01-21 DIAGNOSIS — Z23 Encounter for immunization: Secondary | ICD-10-CM | POA: Diagnosis not present

## 2021-02-06 ENCOUNTER — Ambulatory Visit
Admission: RE | Admit: 2021-02-06 | Discharge: 2021-02-06 | Disposition: A | Discharge status: Home / Self Care | Payer: 59 | Source: Ambulatory Visit | Attending: Internal Medicine | Admitting: Internal Medicine

## 2021-02-06 ENCOUNTER — Other Ambulatory Visit: Payer: Self-pay | Admitting: Internal Medicine

## 2021-02-06 DIAGNOSIS — R928 Other abnormal and inconclusive findings on diagnostic imaging of breast: Secondary | ICD-10-CM

## 2021-03-26 ENCOUNTER — Other Ambulatory Visit (INDEPENDENT_AMBULATORY_CARE_PROVIDER_SITE_OTHER): Payer: 59

## 2021-03-26 DIAGNOSIS — E7800 Pure hypercholesterolemia, unspecified: Secondary | ICD-10-CM | POA: Diagnosis not present

## 2021-03-27 LAB — LIPID PANEL W/O CHOL/HDL RATIO
Cholesterol, Total: 255 mg/dL — ABNORMAL HIGH (ref 100–199)
HDL: 79 mg/dL (ref >39)
LDL Chol Calc (NIH): 160 mg/dL — ABNORMAL HIGH (ref 0–99)
Triglycerides: 94 mg/dL (ref 0–149)
VLDL Cholesterol Cal: 16 mg/dL (ref 5–40)

## 2021-04-06 ENCOUNTER — Telehealth: Payer: Self-pay

## 2021-04-07 ENCOUNTER — Other Ambulatory Visit: Payer: Self-pay

## 2021-04-07 ENCOUNTER — Ambulatory Visit: Payer: 59 | Admitting: Internal Medicine

## 2021-04-07 ENCOUNTER — Encounter: Payer: Self-pay | Admitting: Internal Medicine

## 2021-04-07 VITALS — BP 128/78 | HR 100 | Resp 20 | Ht <= 58 in | Wt 142.0 lb

## 2021-04-07 DIAGNOSIS — B001 Herpesviral vesicular dermatitis: Secondary | ICD-10-CM | POA: Diagnosis not present

## 2021-04-07 DIAGNOSIS — F410 Panic disorder [episodic paroxysmal anxiety] without agoraphobia: Secondary | ICD-10-CM | POA: Diagnosis not present

## 2021-04-07 DIAGNOSIS — F411 Generalized anxiety disorder: Secondary | ICD-10-CM

## 2021-04-07 MED ORDER — ESCITALOPRAM OXALATE 10 MG PO TABS: 10.0000 mg | ORAL_TABLET | Freq: Every day | ORAL | 2 refills | Status: DC

## 2021-04-07 MED ORDER — VALACYCLOVIR HCL 1 G PO TABS: ORAL_TABLET | ORAL | 2 refills | Status: DC

## 2021-04-07 NOTE — Telephone Encounter (Signed)
Patient wanted to be seen after experiencing panic attacks past week.   Has been Seen by Dr Amil Amen

## 2021-04-07 NOTE — Progress Notes (Signed)
° ° °  Subjective:    Patient ID: Elizabeth Ruiz, female   DOB: [birth year], 66 y.o.   MRN: [id number]   HPI  Sick for past week:  outbreak of herpes on lip and into nose.  Not clear when she first had an labial herpes outbreak--she states first outbreak was in June.  Later, she finally states she had her first outbreak of the labial herpes many years ago when ill. Was in [place] with a case of [place] when had labial herpes last time in June.  The provider did not treat her herpes.  Was given antibiotics and fever reducer.   She has just used aloe vera on her labial lesions with improvement Had a low grade fever 1 week ago for a day.      Anxiety:  Having episodes cooking at home or at work, her hands start shake--places her hands flat on her chest, but cannot say if she is having palpitations or is dyspneic.  She has to go outside and take deep breaths.   Episodes started a year ago.  She now can have one every day.  Can last 10 to 30 minutes.  She is having difficulty getting things done when she has them.    Current Meds  Medication Sig   aspirin 81 MG EC tablet Take 81 mg by mouth daily.    No Known Allergies   Review of Systems    Objective:   BP 128/78 (BP Location: Left Arm, Patient Position: Sitting, Cuff Size: Normal)    Pulse 100    Resp 20    Ht 4\' 9"  (1.448 m)    Wt 142 lb (64.4 kg)    BMI 30.73 kg/m   Physical Exam Anxious appearing--difficulty finishing thoughts Healing vesicular lesions on red base, mainly on left upper lip and extending into left nostril and also right and left lower lip.     Assessment & Plan    GAD and Panic Disorder: Affecting her ability to get through her day.  Concerned she will not maintain treatment due to her generalized anxiety as well.  She has agreed to let Gjergj, he husband read the info about Lexapro and call me if there is a concern.  Start on 10 mg daily.  Follow up in 2 weeks.  Will refer her also for counseling to work on calming  measures and possible triggers.  Have felt she has dealt with GAD much of her life.  2.  Herpes Labialis:  Rx for Valacyclovir 2 g twice daily for 2 days if recurrence.  Too late to start with this episode and already healing.  To start with sensation of lip burning or within 48-72 hours of onset of symptoms.

## 2021-04-14 ENCOUNTER — Telehealth: Payer: Self-pay

## 2021-04-14 NOTE — Telephone Encounter (Signed)
Recommended that patient get Night/dayquil for her symptoms. Patient will also come by 04/15/21 for flu and covid test

## 2021-04-14 NOTE — Telephone Encounter (Signed)
Patient called to ask for recommendations or rx after starting what she believes is a flu. She has been coughing and having yellow spit for the past 3 days. Has not taking any medication for this issue.

## 2021-04-15 ENCOUNTER — Other Ambulatory Visit (INDEPENDENT_AMBULATORY_CARE_PROVIDER_SITE_OTHER): Payer: 59

## 2021-04-15 ENCOUNTER — Other Ambulatory Visit: Payer: Self-pay

## 2021-04-15 DIAGNOSIS — R059 Cough, unspecified: Secondary | ICD-10-CM

## 2021-04-15 LAB — POC COVID19 BINAXNOW: SARS Coronavirus 2 Ag: NEGATIVE

## 2021-04-15 LAB — POCT INFLUENZA A/B
Influenza A, POC: NEGATIVE
Influenza B, POC: NEGATIVE

## 2021-04-15 NOTE — Progress Notes (Signed)
Patient came for flu and covid test after experiencing cough and congestion for the past 4 days.   Both covid and flu test were negative. Patient was informed and recommended that they take Ny/Dayquil.

## 2021-04-23 ENCOUNTER — Ambulatory Visit: Payer: 59 | Admitting: Internal Medicine

## 2021-04-28 ENCOUNTER — Other Ambulatory Visit: Payer: Self-pay

## 2021-04-28 ENCOUNTER — Ambulatory Visit (INDEPENDENT_AMBULATORY_CARE_PROVIDER_SITE_OTHER): Payer: 59 | Admitting: Internal Medicine

## 2021-04-28 ENCOUNTER — Encounter: Payer: Self-pay | Admitting: Internal Medicine

## 2021-04-28 VITALS — BP 110/70 | HR 64 | Ht <= 58 in | Wt 146.0 lb

## 2021-04-28 DIAGNOSIS — F411 Generalized anxiety disorder: Secondary | ICD-10-CM

## 2021-04-28 DIAGNOSIS — F410 Panic disorder [episodic paroxysmal anxiety] without agoraphobia: Secondary | ICD-10-CM

## 2021-04-28 MED ORDER — ESCITALOPRAM OXALATE 10 MG PO TABS: 10.0000 mg | ORAL_TABLET | Freq: Every day | ORAL | 11 refills | Status: DC

## 2021-04-28 NOTE — Progress Notes (Signed)
° ° °  Subjective:    Patient ID: Elizabeth Ruiz, female   DOB: 01-25-1956, 66 y.o.   MRN: [id number]   HPI  Anxiety/panic disorder:  no further panic attacks and feels her anxiety is decreased.  She is having a sense of head tremors on and off for past week.  The tremors she does not feel are observable--she just feels the very slight movement more internally.  Husband states he cannot see movement when she complains of the tremor.  Started the Escitalopram about 3 weeks ago.    Current Meds  Medication Sig   aspirin 81 MG EC tablet Take 81 mg by mouth daily.    escitalopram (LEXAPRO) 10 MG tablet Take 1 tablet (10 mg total) by mouth daily.   No Known Allergies   Review of Systems    Objective:   Vitals:   04/28/21 1248  BP: 110/70  Pulse: 64  Height: 4\' 9"  (1.448 m)  Weight: 146 lb (66.2 kg)  BMI (Calculated): 31.59     Physical Exam Right eye twitching, which is chronic is significantly decreased until I bring this up, then starts up again.  Assessment & Plan   Anxiety/Panic Disorder:  continue Escitalopram at 10 mg daily.  To call if sense of tremor worsens.  Follow up in 2 months.

## 2021-07-01 ENCOUNTER — Encounter: Payer: Self-pay | Admitting: Internal Medicine

## 2021-07-05 ENCOUNTER — Other Ambulatory Visit: Payer: Self-pay | Admitting: Internal Medicine

## 2021-08-31 ENCOUNTER — Other Ambulatory Visit: Payer: Self-pay | Admitting: Internal Medicine

## 2021-08-31 ENCOUNTER — Ambulatory Visit
Admission: RE | Admit: 2021-08-31 | Discharge: 2021-08-31 | Disposition: A | Discharge status: Home / Self Care | Payer: BC Managed Care – PPO | Source: Ambulatory Visit | Attending: Internal Medicine | Admitting: Internal Medicine

## 2021-08-31 ENCOUNTER — Ambulatory Visit
Admission: RE | Admit: 2021-08-31 | Discharge: 2021-08-31 | Disposition: A | Discharge status: Home / Self Care | Payer: 59 | Source: Ambulatory Visit | Attending: Internal Medicine | Admitting: Internal Medicine

## 2021-08-31 DIAGNOSIS — R928 Other abnormal and inconclusive findings on diagnostic imaging of breast: Secondary | ICD-10-CM

## 2021-08-31 DIAGNOSIS — N6489 Other specified disorders of breast: Secondary | ICD-10-CM

## 2021-10-26 ENCOUNTER — Other Ambulatory Visit (INDEPENDENT_AMBULATORY_CARE_PROVIDER_SITE_OTHER): Payer: BC Managed Care – PPO

## 2021-10-26 DIAGNOSIS — E7800 Pure hypercholesterolemia, unspecified: Secondary | ICD-10-CM

## 2021-10-26 DIAGNOSIS — Z79899 Other long term (current) drug therapy: Secondary | ICD-10-CM

## 2021-10-27 LAB — COMPREHENSIVE METABOLIC PANEL
ALT: 4 IU/L (ref 0–32)
AST: 13 IU/L (ref 0–40)
Albumin/Globulin Ratio: 1.6 (ref 1.2–2.2)
Albumin: 4.2 g/dL (ref 3.9–4.9)
Alkaline Phosphatase: 59 IU/L (ref 44–121)
BUN/Creatinine Ratio: 17 (ref 12–28)
BUN: 12 mg/dL (ref 8–27)
Bilirubin Total: 0.4 mg/dL (ref 0.0–1.2)
CO2: 23 mmol/L (ref 20–29)
Calcium: 9 mg/dL (ref 8.7–10.3)
Chloride: 103 mmol/L (ref 96–106)
Creatinine, Ser: 0.7 mg/dL (ref 0.57–1.00)
Globulin, Total: 2.6 g/dL (ref 1.5–4.5)
Glucose: 94 mg/dL (ref 70–99)
Potassium: 4.1 mmol/L (ref 3.5–5.2)
Sodium: 140 mmol/L (ref 134–144)
Total Protein: 6.8 g/dL (ref 6.0–8.5)
eGFR: 95 mL/min/{1.73_m2} (ref >59)

## 2021-10-27 LAB — CBC WITH DIFFERENTIAL/PLATELET
Basophils Absolute: 0 10*3/uL (ref 0.0–0.2)
Basos: 1 %
EOS (ABSOLUTE): 0.2 10*3/uL (ref 0.0–0.4)
Eos: 3 %
Hematocrit: 34.6 % (ref 34.0–46.6)
Hemoglobin: 11.4 g/dL (ref 11.1–15.9)
Immature Grans (Abs): 0 10*3/uL (ref 0.0–0.1)
Immature Granulocytes: 0 %
Lymphocytes Absolute: 2.1 10*3/uL (ref 0.7–3.1)
Lymphs: 40 %
MCH: 30.4 pg (ref 26.6–33.0)
MCHC: 32.9 g/dL (ref 31.5–35.7)
MCV: 92 fL (ref 79–97)
Monocytes Absolute: 0.4 10*3/uL (ref 0.1–0.9)
Monocytes: 8 %
Neutrophils Absolute: 2.5 10*3/uL (ref 1.4–7.0)
Neutrophils: 48 %
Platelets: 212 10*3/uL (ref 150–450)
RBC: 3.75 x10E6/uL — ABNORMAL LOW (ref 3.77–5.28)
RDW: 12.5 % (ref 11.7–15.4)
WBC: 5.2 10*3/uL (ref 3.4–10.8)

## 2021-10-27 LAB — LIPID PANEL W/O CHOL/HDL RATIO
Cholesterol, Total: 246 mg/dL — ABNORMAL HIGH (ref 100–199)
HDL: 69 mg/dL (ref >39)
LDL Chol Calc (NIH): 159 mg/dL — ABNORMAL HIGH (ref 0–99)
Triglycerides: 101 mg/dL (ref 0–149)
VLDL Cholesterol Cal: 18 mg/dL (ref 5–40)

## 2021-10-29 ENCOUNTER — Encounter: Payer: Self-pay | Admitting: Internal Medicine

## 2021-10-29 ENCOUNTER — Ambulatory Visit (INDEPENDENT_AMBULATORY_CARE_PROVIDER_SITE_OTHER): Payer: BC Managed Care – PPO | Admitting: Internal Medicine

## 2021-10-29 VITALS — BP 122/80 | HR 76 | Resp 16 | Ht <= 58 in | Wt 144.0 lb

## 2021-10-29 DIAGNOSIS — E7800 Pure hypercholesterolemia, unspecified: Secondary | ICD-10-CM

## 2021-10-29 DIAGNOSIS — Z0000 Encounter for general adult medical examination without abnormal findings: Secondary | ICD-10-CM

## 2021-10-29 DIAGNOSIS — Z86010 Personal history of colonic polyps: Secondary | ICD-10-CM | POA: Diagnosis not present

## 2021-10-29 DIAGNOSIS — F410 Panic disorder [episodic paroxysmal anxiety] without agoraphobia: Secondary | ICD-10-CM | POA: Diagnosis not present

## 2021-10-29 DIAGNOSIS — B360 Pityriasis versicolor: Secondary | ICD-10-CM

## 2021-10-29 DIAGNOSIS — F411 Generalized anxiety disorder: Secondary | ICD-10-CM

## 2021-10-29 MED ORDER — KETOCONAZOLE 2 % EX SHAM: MEDICATED_SHAMPOO | CUTANEOUS | 1 refills | Status: AC

## 2021-10-29 NOTE — Progress Notes (Signed)
Subjective:    Patient ID: Elizabeth Ruiz, female   DOB: 1955/12/30, 66 y.o.   MRN: [id number]   HPI  CPE without pap  1.  Pap:  Last in 08/2019 and normal.   2.  Mammogram: Had targeted left breast mammogram and U/S for follow up of likely benign findings of breast mass.  Due for repeat annual in December.  No family history of breast cancer.    3.  Osteoprevention:  Normal DEXA in 2021.  She takes in 2 servings of dairy daily.  Walking every day for 25 minutes.  Walks another 10 minutes at lunch.  4.  [place] Cards/FIT:  Last checked in 2019 and negative.  5.  Colonoscopy:  Had 4 colonic adenomas with colonoscopy in 2019.  States has not heard from GI, Dr. Fuller Plan to get the repeat recommended for 2022.  6.  Immunizations:  Keeps changing subject when discussing booster of COVID, tetanus, and pneumococcal.   Immunization History  Administered Date(s) Administered   Influenza,inj,Quad PF,6+ Mos 12/26/2017   Influenza-Unspecified 11/23/2014, 12/22/2017   Moderna Sars-Covid-2 Vaccination 05/07/2019, 06/12/2019   Zoster Recombinat (Shingrix) 10/28/2020, 01/21/2021   .  7.  Glucose/Cholesterol:  Glucose fine.  Her cholesterol remains high with excellent HDL, but LDL remains too high at 159.   Lipid Panel     Component Value Date/Time   CHOL 246 (H) 10/26/2021 0847   TRIG 101 10/26/2021 0847   HDL 69 10/26/2021 0847   LDLCALC 159 (H) 10/26/2021 0847   LABVLDL 18 10/26/2021 0847     Current Meds  Medication Sig   aspirin 81 MG EC tablet Take 81 mg by mouth daily.    escitalopram (LEXAPRO) 10 MG tablet TAKE 1 TABLET(10 MG) BY MOUTH DAILY   No Known Allergies  Past Medical History:  Diagnosis Date   Anemia    Arthritis    Blood transfusion without reported diagnosis    UGI bleed in 2012   GERD (gastroesophageal reflux disease)    2012:  ?[place]:  had gastric erosions diagnosed with EGD following UGI bleed.  Resolved with course of Ranitidine   History of anemia 08/23/2016    Hyperlipidemia 08/23/2016   Leg cramps    Tinea versicolor 2016   Upper GI bleed    NSAID provoked gastritis    Past Surgical History:  Procedure Laterality Date   BREAST SURGERY Right 2009   Brease Biopsy with benign Fibroma resulting   left ear surgery Left 03/27/2019   Dr. 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 with [place]   Originally from [place]   Lives with sister, Reesa Chew   Social Determinants of Health   Financial Resource Strain: Low Risk  (10/29/2021)   Overall Financial Resource Strain (CARDIA)    Difficulty of Paying Living Expenses: Not hard at all  Food Insecurity: No Food Insecurity (10/29/2021)   Hunger Vital Sign    Worried About Running Out of Food in the Last Year: Never true    [place] in the Last Year: Never true  Transportation Needs: No Transportation Needs (10/29/2021)   PRAPARE - Hydrologist (Medical): No    Lack of Transportation (Non-Medical): No  Physical Activity: Insufficiently Active (05/04/2018)   Exercise Vital Sign    Days of Exercise per Week: 5  days    Minutes of Exercise per Session: 10 min  Stress: Not on file  Social Connections: Not on file  Intimate Partner Violence: Not At Risk (10/29/2021)   Humiliation, Afraid, Rape, and Kick questionnaire    Fear of Current or Ex-Partner: No    Emotionally Abused: No    Physically Abused: No    Sexually Abused: No      Review of Systems  Skin:        Feels she has been getting red itchy splotches scattered over body on and off since starting Citalopram.  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  Also, can have with hands as well.  Sounds like at times has bilateral hand tremors.  Feels this and difficulties sleeping started after initiation of Citalopram for anxiety.       Objective:   BP 122/80 (BP Location: Left Arm, Patient Position: Sitting, Cuff Size: Normal)   Pulse 76   Resp 16   Ht 4' 9.5" (1.461 m)   Wt 144 lb (65.3 kg)   BMI 30.62 kg/m   Physical Exam Constitutional:      Appearance: She is obese.  HENT:     Head: Normocephalic and atraumatic.     Right Ear: Tympanic membrane, ear canal and external ear normal.     Left Ear: Ear canal and external ear normal.     Ears:     Comments: Left TM thickened and wet, dull.    Nose: Nose normal.     Mouth/Throat:     Mouth: Mucous membranes are moist.     Pharynx: Oropharynx is clear.  Eyes:     Extraocular Movements: Extraocular movements intact.     Conjunctiva/sclera: Conjunctivae normal.     Pupils: Pupils are equal, round, and reactive to light.     Comments: Chronic twitch of right eyelid with squinting.  Discs sharp bilaterally.  Neck:     Thyroid: No thyroid mass or thyromegaly.  Cardiovascular:     Rate and Rhythm: Normal rate and regular rhythm.     Heart sounds: S1 normal and S2 normal. No murmur heard.    No friction rub. No S3 or S4 sounds.     Comments: No carotid bruits.  Carotid, radial, femoral, DP and PT pulses normal and equal.    Pulmonary:      Effort: Pulmonary effort is normal.     Breath sounds: Normal breath sounds.  Chest:  Breasts:    Right: No inverted nipple, mass or nipple discharge.  Left: No inverted nipple, mass or nipple discharge.  Abdominal:     General: Bowel sounds are normal.     Palpations: Abdomen is soft. There is no hepatomegaly, splenomegaly or mass.     Tenderness: There is no abdominal tenderness.     Hernia: No hernia is present.  Genitourinary:    Comments: Normal external female genitalia Difficulty relaxing for exam, but no uterine or adnexal mass or tenderness noted. Musculoskeletal:        General: Normal range of motion.     Cervical back: Normal range of motion and neck supple.     Right lower leg: No edema.     Left lower leg: No edema.  Lymphadenopathy:     Head:     Right side of head: No submental or submandibular adenopathy.     Left side of head: No submental or submandibular adenopathy.     Cervical: No cervical adenopathy.     Upper Body:     Right upper body: No supraclavicular or axillary adenopathy.     Left upper body: No supraclavicular or axillary adenopathy.     Lower Body: No right inguinal adenopathy. No left inguinal adenopathy.  Skin:    General: Skin is warm.     Capillary Refill: Capillary refill takes less than 2 seconds.     Findings: Rash (ovoid to round pink lesions with fine flaking all over upper back and upper anterior chest.) present.  Neurological:     General: No focal deficit present.     Mental Status: She is alert and oriented to person, place, and time.     Cranial Nerves: Cranial nerves 2-12 are intact.     Sensory: Sensation is intact.     Motor: Motor function is intact.     Coordination: Coordination is intact.     Gait: Gait is intact.     Deep Tendon Reflexes: Reflexes are normal and symmetric.     Comments: Mild head nod tremor at times No tremor of hands noted.  Psychiatric:        Attention and Perception: Attention normal.         Mood and Affect: Mood is anxious.        Speech: Speech normal.        Behavior: Behavior is cooperative.      Assessment & Plan    CPE without pap Mammogram repeat in December Needs repeat Colonoscopy--behind on recheck with adenomatous polyps.  Referral sent. Discussed labs other than elevated LDL fine. Refusing Pneumococcal, Tdap, COVID vaccinations.   Encouraged influenza vaccine in Sept/Oct.  2.  Hypercholesterolemia:  discussed dietary ways to improve LDL.  HDL remains high.    3.  Anxiety/Panic Disorder:  She very much feels her skin, sleep, and tremors are all due to taking Citalopram.  Actually surprised she has remained on medication this long as she is very anxious about taking any medication.  Will wean Citalopram slowly and see how she does.  4.  Tinea versicolor:  ketoconazole shampoo to be applied to wet skin once daily and washed off after 10 minutes for 14 days

## 2021-10-29 NOTE — Patient Instructions (Signed)
To wean Citalopram:    For 2 weeks:  take 1/2 tab alternating with 1 tab every other day For next 2 weeks:  take 1/2 tab daily For next 2 weeks:  take 1/2 tab every other day. For next 2 weeks:  take 1/2 tab every 3rd day. For next 2 weeks:  take 1/2 tab every 4th day Stop taking Citalopram  Call if any problems as you gradually stop taking Citalopram

## 2022-03-04 ENCOUNTER — Ambulatory Visit
Admission: RE | Admit: 2022-03-04 | Discharge: 2022-03-04 | Disposition: A | Discharge status: Home / Self Care | Payer: BC Managed Care – PPO | Source: Ambulatory Visit | Attending: Internal Medicine | Admitting: Internal Medicine

## 2022-03-04 DIAGNOSIS — N6489 Other specified disorders of breast: Secondary | ICD-10-CM

## 2022-03-09 ENCOUNTER — Other Ambulatory Visit (INDEPENDENT_AMBULATORY_CARE_PROVIDER_SITE_OTHER): Payer: BC Managed Care – PPO

## 2022-03-09 DIAGNOSIS — Z862 Personal history of diseases of the blood and blood-forming organs and certain disorders involving the immune mechanism: Secondary | ICD-10-CM

## 2022-03-10 LAB — CBC WITH DIFFERENTIAL/PLATELET
Basophils Absolute: 0.1 10*3/uL (ref 0.0–0.2)
Basos: 1 %
EOS (ABSOLUTE): 0.2 10*3/uL (ref 0.0–0.4)
Eos: 3 %
Hematocrit: 35.5 % (ref 34.0–46.6)
Hemoglobin: 11.6 g/dL (ref 11.1–15.9)
Immature Grans (Abs): 0 10*3/uL (ref 0.0–0.1)
Immature Granulocytes: 0 %
Lymphocytes Absolute: 2 10*3/uL (ref 0.7–3.1)
Lymphs: 42 %
MCH: 30.4 pg (ref 26.6–33.0)
MCHC: 32.7 g/dL (ref 31.5–35.7)
MCV: 93 fL (ref 79–97)
Monocytes Absolute: 0.4 10*3/uL (ref 0.1–0.9)
Monocytes: 7 %
Neutrophils Absolute: 2.3 10*3/uL (ref 1.4–7.0)
Neutrophils: 47 %
Platelets: 272 10*3/uL (ref 150–450)
RBC: 3.82 x10E6/uL (ref 3.77–5.28)
RDW: 12.3 % (ref 11.7–15.4)
WBC: 4.9 10*3/uL (ref 3.4–10.8)

## 2022-03-12 ENCOUNTER — Encounter: Payer: Self-pay | Admitting: Internal Medicine

## 2022-03-12 ENCOUNTER — Ambulatory Visit: Payer: BC Managed Care – PPO | Admitting: Internal Medicine

## 2022-03-12 VITALS — BP 136/78 | HR 76 | Resp 16 | Ht <= 58 in | Wt 151.0 lb

## 2022-03-12 DIAGNOSIS — E7800 Pure hypercholesterolemia, unspecified: Secondary | ICD-10-CM

## 2022-03-12 DIAGNOSIS — R253 Fasciculation: Secondary | ICD-10-CM

## 2022-03-12 DIAGNOSIS — B360 Pityriasis versicolor: Secondary | ICD-10-CM

## 2022-03-12 DIAGNOSIS — F410 Panic disorder [episodic paroxysmal anxiety] without agoraphobia: Secondary | ICD-10-CM

## 2022-03-12 DIAGNOSIS — N63 Unspecified lump in unspecified breast: Secondary | ICD-10-CM

## 2022-03-12 NOTE — Progress Notes (Signed)
    Subjective:    Patient ID: Elizabeth Ruiz, female   DOB: [birth year], 67 y.o.   MRN: [id number]   HPI   Anxiety:  Weaned off Citalopram completely by mid October she thinks.  Side effects she related to Citalopram resolved.  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    2.  Left eye twitching: Has had for several years.  Does not seem to be worsening.  No tremor of hand, arms.  She does at time feel a "vibration" of her head.  Her husband, Everitt Amber does not notice the latter.  She seems to have the twitching more so when she is thinking about too many things.  Has not really noted following poor sleep.    3.  Hypercholesterolemia:  discussed her cholesterol remains a bit high, but improving.  Most of her family has issues with hypercholesterolemia.  Describes a healthy diet.   Lipid Panel     Component Value Date/Time   CHOL 246 (H) 10/26/2021 0847   TRIG 101 10/26/2021 0847   HDL 69 10/26/2021 0847   LDLCALC 159 (H) 10/26/2021 0847   LABVLDL 18 10/26/2021 0847   4.  Repeat CBC--rbc count was minimally low with last CBC in August, though hgb fine.  Discussed RBC count now in normal range.  5.  Tinea Versicolor:  has not used the Nizoral shampoo--feels she is not having a problem now in cold weather.  She will treat when she usually has a flare in the spring.  Current Meds  Medication Sig   aspirin 81 MG EC tablet Take 81 mg by mouth daily.    No Known Allergies   Review of Systems    Objective:   BP 136/78 (BP Location: Right Arm, Patient Position: Sitting, Cuff Size: Normal)   Pulse 76   Resp 16   Ht 4' 9.5" (1.461 m)   Wt 151 lb (68.5 kg)   BMI 32.11 kg/m   Physical Exam NAD HEENT:  PERRL, EOMI, Bilateral eyelids with drooping close to upper lash line, left with a bit of edema, but no erythema.  Left eyelids, upper and lower with intermittent twitching.  At times, note a very subtle almost rotational  twitch of head that is very short lived. Hands steady when held out.   Lungs:  CTA CV:  RRR without murmur or rub.    Assessment & Plan    Anxiety:  She feels this is no longer an issue as her job duties are reduced to better level.  She feels better off Citalopram.  2.  Eyelid and perhaps subtle head tremor:  This is a long standing problem we have discussed before.   Offered Neurology referral, which she is not interested in today, nor medication of any sort.  Encouraged working on stress relief and making sure she has good sleep.  Father with hand tremor, but very late in his 39s.   Will send out info about different tremors for her to read.  3.  Hypercholesterolemia:  improved with excellent HDL, but would like to see LDL down about 15-20 points as well.  Encouraged more physical activity.  4.  Mildly low RBC count with normal hgb:  resolved  5.  Tinea versicolor:  quiescent currently.

## 2022-09-13 ENCOUNTER — Telehealth: Payer: Self-pay

## 2022-09-13 NOTE — Telephone Encounter (Signed)
Spouse stated that patient would like to be on waiting for a sooner CPE appointment than her 11/01/22   Will call patient if there is any cancellation.

## 2022-11-01 ENCOUNTER — Ambulatory Visit (INDEPENDENT_AMBULATORY_CARE_PROVIDER_SITE_OTHER): Payer: BLUE CROSS/BLUE SHIELD | Admitting: Internal Medicine

## 2022-11-01 ENCOUNTER — Encounter: Payer: Self-pay | Admitting: Internal Medicine

## 2022-11-01 VITALS — BP 138/72 | HR 76 | Resp 16 | Ht <= 58 in | Wt 146.0 lb

## 2022-11-01 DIAGNOSIS — D369 Benign neoplasm, unspecified site: Secondary | ICD-10-CM

## 2022-11-01 DIAGNOSIS — Z0000 Encounter for general adult medical examination without abnormal findings: Secondary | ICD-10-CM | POA: Diagnosis not present

## 2022-11-01 DIAGNOSIS — R21 Rash and other nonspecific skin eruption: Secondary | ICD-10-CM

## 2022-11-01 DIAGNOSIS — E7800 Pure hypercholesterolemia, unspecified: Secondary | ICD-10-CM | POA: Diagnosis not present

## 2022-11-01 DIAGNOSIS — Z79899 Other long term (current) drug therapy: Secondary | ICD-10-CM | POA: Diagnosis not present

## 2022-11-01 MED ORDER — TRIAMCINOLONE ACETONIDE 0.1 % EX CREA: 1.0000 | TOPICAL_CREAM | Freq: Two times a day (BID) | CUTANEOUS | 0 refills | Status: AC

## 2022-11-01 NOTE — Patient Instructions (Signed)
Recommended vaccines:   Pneumococcal 20  Tdap COVID booster Influenza vaccine

## 2022-11-01 NOTE — Progress Notes (Signed)
Subjective:    Patient ID: Elizabeth Ruiz, female   DOB: [birth year], 67 y.o.   MRN: [id number]   HPI  CPE with pap  1.  Pap:  Last in 2021 and always normal.    2.  Mammogram:  Has stable mass in left breast that is felt to benign.  No family history of breast cancer.    3.  Osteoprevention:  Bone density normal in 2021.  Eats cheese 3 times daily.  Walks twice daily outside.   4.  Guaiac Cards/FIT:  Last 2018 and negative.  5.  Colonoscopy:  Last in 2019 with Dr. Russella Dar and was to have repeat in 2022, but somehow did not connect with GI office scheduling.     6.  Immunizations:  Still not sure about the 4 vaccines recommended. Immunization History  Administered Date(s) Administered   Influenza,inj,Quad PF,6+ Mos 12/26/2017   Influenza-Unspecified 11/23/2014, 12/22/2017   Moderna Sars-Covid-2 Vaccination 05/07/2019, 06/12/2019   Zoster Recombinant(Shingrix) 10/28/2020, 01/21/2021     7.  Glucose/Cholesterol:  BG fine in past, but cholesterol panel remains high with high LDL and HDL. Lipid Panel     Component Value Date/Time   CHOL 246 (H) 10/26/2021 0847   TRIG 101 10/26/2021 0847   HDL 69 10/26/2021 0847   LDLCALC 159 (H) 10/26/2021 0847   LABVLDL 18 10/26/2021 0847     Current Meds  Medication Sig   aspirin 81 MG EC tablet Take 81 mg by mouth daily.    ketoconazole (NIZORAL) 2 % shampoo Apply to upper back and chest at beginning of shower daily and rinse off after 10 minutes for 14 days.   No Known Allergies  Past Medical History:  Diagnosis Date   Anemia    Arthritis    Blood transfusion without reported diagnosis    UGI bleed in 2012   GERD (gastroesophageal reflux disease)    2012:  [place]  had gastric erosions diagnosed with EGD following UGI bleed.  Resolved with course of Ranitidine   History of anemia 08/23/2016   Hyperlipidemia 08/23/2016   Leg cramps    Tinea versicolor 2016   Upper GI bleed    NSAID provoked gastritis   Past Surgical History:   Procedure Laterality Date   BREAST SURGERY Right 2009   Brease Biopsy with benign Fibroma resulting   left ear surgery Left 03/27/2019   Dr. 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   Originally from [place]   No longer lives with [place]   Social Determinants of Health   Financial Resource Strain: Low Risk  (11/01/2022)   Overall Financial Resource Strain (CARDIA)    Difficulty of Paying Living Expenses: Not hard at all  Food Insecurity: No Food Insecurity (11/01/2022)   Hunger Vital Sign    Worried About Running Out of Food in the Last Year: Never true    Ran Out of Food in the Last Year: Never true  Transportation Needs: No Transportation Needs (11/01/2022)   PRAPARE - Administrator, Civil Service (Medical): No    Lack of Transportation (Non-Medical): No  Physical Activity: Insufficiently Active (05/04/2018)   Exercise Vital Sign    Days of Exercise per Week: 5 days    Minutes of Exercise per Session: 10 min  Stress: Not on file  Social Connections: Not on file  Intimate Partner  Violence: Not At Risk (11/01/2022)   Humiliation, Afraid, Rape, and Kick questionnaire    Fear of Current or Ex-Partner: No    Emotionally Abused: No    Physically Abused: No    Sexually Abused: No      Review of Systems  HENT:  Negative for dental problem.   Eyes:  Negative for visual disturbance (floaters in right eye- when traveling.  Has not yet made an appt with [facility].).  Respiratory:  Negative for shortness of breath.   Cardiovascular:  Positive for leg swelling (From sitting at computer). Negative for chest pain and palpitations.  Skin:  Positive for rash (Red rash in righ suprpubic area started about 1 week ago.  Started with one lesion and then a week later broke out with many lesions at once.   A bit itchy.  Husband without rash, who sleeps in same bed.  She walks in [employer] park daily.  Pooll 2w before).  Neurological:  Positive for numbness (tips of fingers with swimming in water..  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      Objective:   BP 138/72 (BP Location: Left Arm, Patient Position: Sitting, Cuff Size: Normal)   Pulse 76   Resp 16   Ht 4' 9.5" (1.461 m)   Wt 146 lb (66.2 kg)   BMI 31.05 kg/m   Physical Exam HENT:     Head: Normocephalic and atraumatic.     Right Ear: Tympanic membrane, ear canal and external ear normal.     Left Ear: Tympanic membrane, ear canal and external ear normal.     Mouth/Throat:     Mouth: Mucous membranes are moist.     Pharynx: Oropharynx is clear.  Eyes:     Extraocular Movements: Extraocular movements intact.     Conjunctiva/sclera: Conjunctivae normal.     Pupils: Pupils are equal, round, and reactive to light.     Comments: Discs sharp  Neck:     Thyroid: No thyroid mass or thyromegaly.  Cardiovascular:     Rate and Rhythm: Normal rate and regular rhythm.     Heart sounds: S1 normal and S2 normal. No murmur heard.    No friction rub. No S3 or S4 sounds.     Comments: No carotid bruits.   Carotid, radial, femoral, DP and PT pulses normal and equal.   Pulmonary:     Effort: Pulmonary effort is normal.     Breath sounds: Normal breath sounds and air entry.  Chest:  Breasts:    Right: No mass, nipple discharge or skin change.     Left: No mass, nipple discharge or skin change.  Abdominal:     General: Bowel sounds are normal.     Palpations: Abdomen is soft. There is no hepatomegaly, splenomegaly or mass.     Tenderness: There is no abdominal tenderness.     Hernia: No hernia is present.  Genitourinary:    Comments: Normal external female genitalia No uterine or adnexal mass or tenderness.   Musculoskeletal:        General: Normal range of motion.     Cervical back: Normal range of motion and neck supple.     Right lower leg: No edema.     Left lower leg: No edema.  Feet:     Right foot:     Skin integrity: Skin integrity normal.     Toenail Condition: Right toenails are normal.     Left foot:     Skin integrity: Skin integrity normal.     Toenail Condition: Left toenails are normal.  Lymphadenopathy:     Head:     Right side of head: No submental or submandibular adenopathy.     Left side of head: No submental or submandibular adenopathy.     Cervical: No cervical adenopathy.     Upper Body:     Right upper body: No supraclavicular or axillary adenopathy.     Left upper body: No supraclavicular or axillary adenopathy.     Lower Body: No right inguinal adenopathy. No left inguinal adenopathy.  Skin:    General: Skin is warm.     Capillary Refill: Capillary refill takes less than 2 seconds.     Findings: Rash present.     Comments: Faint pink slightly raised flat lesions scattered in right lower abdomen and groin.  Do not appear perifollicular.   Neurological:     General: No focal deficit present.     Mental Status: She is alert and oriented to person, place, and time.     Cranial Nerves: Cranial nerves 2-12 are intact.     Sensory: Sensation is intact.      Motor: Motor function is intact.     Coordination: Coordination is intact.     Gait: Gait is intact.     Deep Tendon Reflexes: Reflexes are normal and symmetric.     Comments: Chronic twitching of right eyelids.  Psychiatric:        Speech: Speech normal.        Behavior: Behavior normal. Behavior is cooperative.      Assessment & Plan   CPE without pap Encouraged influenza vaccine clinic and COVID booster.   Encouraged Pneumococcal vaccine and Tdap No interest in above vaccines at this time. CBC, CMP  2.  History of colonic adenoma:  rerefer for follow up colonoscopy.   3.  Rash:  not clear of cause:  Triamcinolone cream twice daily to lesions.  Call if new develop or these do not resolve.    4.  Hyperlipidemia/obesity:  encouraged dietary changes and getting back to physical activity.  FLP

## 2022-11-01 NOTE — Telephone Encounter (Signed)
Patient has been seen.

## 2022-11-02 LAB — COMPREHENSIVE METABOLIC PANEL
ALT: 8 IU/L (ref 0–32)
AST: 18 IU/L (ref 0–40)
Albumin: 4.8 g/dL (ref 3.9–4.9)
Alkaline Phosphatase: 72 IU/L (ref 44–121)
BUN/Creatinine Ratio: 13 (ref 12–28)
BUN: 9 mg/dL (ref 8–27)
Bilirubin Total: 0.5 mg/dL (ref 0.0–1.2)
CO2: 23 mmol/L (ref 20–29)
Calcium: 9.3 mg/dL (ref 8.7–10.3)
Chloride: 101 mmol/L (ref 96–106)
Creatinine, Ser: 0.7 mg/dL (ref 0.57–1.00)
Globulin, Total: 2.8 g/dL (ref 1.5–4.5)
Glucose: 88 mg/dL (ref 70–99)
Potassium: 3.8 mmol/L (ref 3.5–5.2)
Sodium: 141 mmol/L (ref 134–144)
Total Protein: 7.6 g/dL (ref 6.0–8.5)
eGFR: 95 mL/min/{1.73_m2} (ref >59)

## 2022-11-02 LAB — CBC WITH DIFFERENTIAL/PLATELET
Basophils Absolute: 0.1 10*3/uL (ref 0.0–0.2)
Basos: 1 %
EOS (ABSOLUTE): 0.1 10*3/uL (ref 0.0–0.4)
Eos: 2 %
Hematocrit: 37.3 % (ref 34.0–46.6)
Hemoglobin: 11.6 g/dL (ref 11.1–15.9)
Immature Grans (Abs): 0 10*3/uL (ref 0.0–0.1)
Immature Granulocytes: 0 %
Lymphocytes Absolute: 2.7 10*3/uL (ref 0.7–3.1)
Lymphs: 39 %
MCH: 29.2 pg (ref 26.6–33.0)
MCHC: 31.1 g/dL — ABNORMAL LOW (ref 31.5–35.7)
MCV: 94 fL (ref 79–97)
Monocytes Absolute: 0.5 10*3/uL (ref 0.1–0.9)
Monocytes: 7 %
Neutrophils Absolute: 3.4 10*3/uL (ref 1.4–7.0)
Neutrophils: 51 %
Platelets: 280 10*3/uL (ref 150–450)
RBC: 3.97 x10E6/uL (ref 3.77–5.28)
RDW: 11.9 % (ref 11.7–15.4)
WBC: 6.8 10*3/uL (ref 3.4–10.8)

## 2022-11-02 LAB — LIPID PANEL W/O CHOL/HDL RATIO
Cholesterol, Total: 268 mg/dL — ABNORMAL HIGH (ref 100–199)
HDL: 74 mg/dL (ref >39)
LDL Chol Calc (NIH): 173 mg/dL — ABNORMAL HIGH (ref 0–99)
Triglycerides: 119 mg/dL (ref 0–149)
VLDL Cholesterol Cal: 21 mg/dL (ref 5–40)

## 2022-11-22 ENCOUNTER — Encounter: Payer: Self-pay | Admitting: Gastroenterology

## 2022-12-13 ENCOUNTER — Ambulatory Visit (AMBULATORY_SURGERY_CENTER): Payer: BLUE CROSS/BLUE SHIELD

## 2022-12-13 VITALS — Ht <= 58 in | Wt 149.0 lb

## 2022-12-13 DIAGNOSIS — Z860101 Personal history of adenomatous and serrated colon polyps: Secondary | ICD-10-CM

## 2022-12-13 MED ORDER — NA SULFATE-K SULFATE-MG SULF 17.5-3.13-1.6 GM/177ML PO SOLN: 1.0000 | Freq: Once | ORAL | 0 refills | Status: AC

## 2022-12-13 NOTE — Progress Notes (Signed)
Pre visit completed in person with interpreter; Patient verified name, DOB, and address; No egg or soy allergy known to patient;  No issues known to pt with past sedation with any surgeries or procedures; Patient denies ever being told they had issues or difficulty with intubation;  No FH of Malignant Hyperthermia; Pt is not on diet pills; Pt is not on home 02;  Pt is not on blood thinners;  Pt denies issues with constipation;  No A fib or A flutter; Have any cardiac testing pending--NO Insurance verified during PV appt--- BCBS Pt can ambulate without assistance;  Pt denies use of chewing tobacco; Discussed diabetic/weight loss medication holds; Discussed NSAID holds; Checked BMI to be less than 50; Pt instructed to use Singlecare.com or GoodRx for a price reduction on prep;  Patient's chart reviewed by Cathlyn Parsons CNRA prior to previsit and patient appropriate for the [facility]; Pre visit completed and red dot placed by patient's name on their procedure day (on provider's schedule);    Instructions printed and given to patient during PV appt; Interpreter Vonna Drafts present for PV appt;

## 2022-12-14 ENCOUNTER — Encounter: Payer: Self-pay | Admitting: Gastroenterology

## 2022-12-23 ENCOUNTER — Encounter: Payer: Self-pay | Admitting: Certified Registered Nurse Anesthetist

## 2022-12-27 ENCOUNTER — Encounter: Payer: Self-pay | Admitting: Gastroenterology

## 2022-12-27 ENCOUNTER — Ambulatory Visit (AMBULATORY_SURGERY_CENTER): Payer: BLUE CROSS/BLUE SHIELD | Admitting: Gastroenterology

## 2022-12-27 VITALS — BP 120/66 | HR 63 | Temp 97.0°F | Resp 14 | Ht <= 58 in | Wt 149.0 lb

## 2022-12-27 DIAGNOSIS — Z1211 Encounter for screening for malignant neoplasm of colon: Secondary | ICD-10-CM | POA: Diagnosis present

## 2022-12-27 DIAGNOSIS — Z860100 Personal history of colon polyps, unspecified: Secondary | ICD-10-CM | POA: Diagnosis not present

## 2022-12-27 DIAGNOSIS — D123 Benign neoplasm of transverse colon: Secondary | ICD-10-CM

## 2022-12-27 DIAGNOSIS — Z09 Encounter for follow-up examination after completed treatment for conditions other than malignant neoplasm: Secondary | ICD-10-CM

## 2022-12-27 MED ORDER — SODIUM CHLORIDE 0.9 % IV SOLN: 500.0000 mL | INTRAVENOUS | Status: DC

## 2022-12-27 NOTE — Progress Notes (Signed)
Called to room to assist during endoscopic procedure.  Patient ID and intended procedure confirmed with present staff. Received instructions for my participation in the procedure from the performing physician.  

## 2022-12-27 NOTE — Progress Notes (Signed)
Report given to PACU, vss 

## 2022-12-27 NOTE — Op Note (Signed)
[facility] Patient Name: Elizabeth Ruiz Procedure Date: 12/27/2022 1:46 PM MRN: [id number] Endoscopist: Meryl Dare , MD, [phone] Age: 67 Referring MD:  Date of Birth: Jul 08, 1955 Gender: Female Account #: [id number] Procedure:                Colonoscopy Indications:              Surveillance: Personal history of adenomatous and                            sessile serrated polyps on last colonoscopy 5 years                            ago Medicines:                Monitored Anesthesia Care Procedure:                Pre-Anesthesia Assessment:                           - Prior to the procedure, a History and Physical                            was performed, and patient medications and                            allergies were reviewed. The patient's tolerance of                            previous anesthesia was also reviewed. The risks                            and benefits of the procedure and the sedation                            options and risks were discussed with the patient.                            All questions were answered, and informed consent                            was obtained. Prior Anticoagulants: The patient has                            taken no anticoagulant or antiplatelet agents. ASA                            Grade Assessment: II - A patient with mild systemic                            disease. After reviewing the risks and benefits,                            the patient was deemed in satisfactory condition to  undergo the procedure.                           After obtaining informed consent, the colonoscope                            was passed under direct vision. Throughout the                            procedure, the patient's blood pressure, pulse, and                            oxygen saturations were monitored continuously. The                            CF [id number] #[id number] was introduced through the anus                             and advanced to the the cecum, identified by                            appendiceal orifice and ileocecal valve. The                            ileocecal valve, appendiceal orifice, and rectum                            were photographed. The quality of the bowel                            preparation was good. The colonoscopy was performed                            without difficulty. The patient tolerated the                            procedure well. Scope In: 2:09:03 PM Scope Out: 2:21:08 PM Scope Withdrawal Time: 0 hours 9 minutes 52 seconds  Total Procedure Duration: 0 hours 12 minutes 5 seconds  Findings:                 The perianal and digital rectal examinations were                            normal.                           A 6 mm polyp was found in the transverse colon. The                            polyp was sessile. The polyp was removed with a                            cold snare. Resection and retrieval were complete.  The exam was otherwise without abnormality on                            direct and retroflexion views. Complications:            No immediate complications. Estimated blood loss:                            None. Estimated Blood Loss:     Estimated blood loss: none. Impression:               - One 6 mm polyp in the transverse colon, removed                            with a cold snare. Resected and retrieved.                           - The examination was otherwise normal on direct                            and retroflexion views. Recommendation:           - Repeat colonoscopy after studies are complete for                            surveillance based on pathology results.                           - Patient has a contact number available for                            emergencies. The signs and symptoms of potential                            delayed complications were discussed with the                             patient. Return to normal activities tomorrow.                            Written discharge instructions were provided to the                            patient.                           - Resume previous diet.                           - Continue present medications.                           - Await pathology results. Meryl Dare, MD 12/27/2022 2:31:40 PM This report has been signed electronically.

## 2022-12-27 NOTE — Patient Instructions (Addendum)
- Repeat colonoscopy after studies are complete for                            surveillance based on pathology results.                           - Patient has a contact number available for                            emergencies. The signs and symptoms of potential                            delayed complications were discussed with the                            patient. Return to normal activities tomorrow.                            Written discharge instructions were provided to the                            patient.                           - Resume previous diet.                           - Continue present medications.                           - Await pathology results. 1 polyp removed.  Handout given to patient.    YOU HAD AN ENDOSCOPIC PROCEDURE TODAY AT THE [facility]:   Refer to the procedure report that was given to you for any specific questions about what was found during the examination.  If the procedure report does not answer your questions, please call your gastroenterologist to clarify.  If you requested that your care partner not be given the details of your procedure findings, then the procedure report has been included in a sealed envelope for you to review at your convenience later.  YOU SHOULD EXPECT: Some feelings of bloating in the abdomen. Passage of more gas than usual.  Walking can help get rid of the air that was put into your GI tract during the procedure and reduce the bloating. If you had a lower endoscopy (such as a colonoscopy or flexible sigmoidoscopy) you may notice spotting of blood in your stool or on the toilet paper. If you underwent a bowel prep for your procedure, you may not have a normal bowel movement for a few days.  Please Note:  You might notice some irritation and congestion in your nose or some drainage.  This is from the oxygen used during your procedure.  There is no need for concern and it should clear  up in a day or so.  SYMPTOMS TO REPORT IMMEDIATELY:  Following lower endoscopy (colonoscopy or flexible sigmoidoscopy):  Excessive amounts of blood in the stool  Significant tenderness or worsening of abdominal pains  Swelling of the abdomen that is new, acute  Fever of 100F or higher  For urgent or emergent issues, a gastroenterologist can be reached at any hour by calling [phone]. Do not use MyChart messaging for urgent concerns.    DIET:  We do recommend a small meal at first, but then you may proceed to your regular diet.  Drink plenty of fluids but you should avoid alcoholic beverages for 24 hours.  ACTIVITY:  You should plan to take it easy for the rest of today and you should NOT DRIVE or use heavy machinery until tomorrow (because of the sedation medicines used during the test).    FOLLOW UP: Our staff will call the number listed on your records the next business day following your procedure.  We will call around 7:15- 8:00 am to check on you and address any questions or concerns that you may have regarding the information given to you following your procedure. If we do not reach you, we will leave a message.     If any biopsies were taken you will be contacted by phone or by letter within the next 1-3 weeks.  Please call us at [phone] if you have not heard about the biopsies in 3 weeks.    SIGNATURES/CONFIDENTIALITY: You and/or your care partner have signed paperwork which will be entered into your electronic medical record.  These signatures attest to the fact that that the information above on your After Visit Summary has been reviewed and is understood.  Full responsibility of the confidentiality of this discharge information lies with you and/or your care-partner.

## 2022-12-27 NOTE — Progress Notes (Signed)
History & Physical  Primary Care Physician:  Julieanne Manson, MD Primary Gastroenterologist: Claudette Head, MD  Impression / Plan:  Personal history of adenomatous and sessile serrated colon polyps for surveillance colonoscopy.  CHIEF COMPLAINT:  Personal history of colon polyps   HPI: Elizabeth Ruiz is a 67 y.o. female with a personal history of adenomatous and sessile serrated colon polyps for surveillance colonoscopy.    Past Medical History:  Diagnosis Date   Anemia 2012   secondary to gi bleed   Anxiety    situational anxiety   Blood transfusion without reported diagnosis 2012   UGI bleed   GERD (gastroesophageal reflux disease) 2012   2012:  ?[place]  had gastric erosions diagnosed with EGD following UGI bleed.  Resolved with course of Ranitidine   History of anemia 08/23/2016   Hyperlipidemia 08/23/2016   Leg cramps    Tinea versicolor 2016   Upper GI bleed    NSAID provoked gastritis    Past Surgical History:  Procedure Laterality Date   BREAST SURGERY Right 2009   Brease Biopsy with benign Fibroma resulting   left ear surgery Left 03/27/2019   Dr. Ermalinda Barrios, ENT   UPPER GASTROINTESTINAL ENDOSCOPY  2012    Prior to Admission medications   Medication Sig Start Date End Date Taking? Authorizing Provider  aspirin 81 MG EC tablet Take 81 mg by mouth daily.     [provider]  chlorhexidine (PERIDEX) 0.12 % solution Use as directed 15 mLs in the mouth or throat 2 (two) times daily as needed. 10/11/19   [provider]  ketoconazole (NIZORAL) 2 % shampoo Apply to upper back and chest at beginning of shower daily and rinse off after 10 minutes for 14 days. Patient not taking: Reported on 12/13/2022 10/29/21   Julieanne Manson, MD  triamcinolone cream (KENALOG) 0.1 % Apply 1 Application topically 2 (two) times daily. 11/01/22   Julieanne Manson, MD    Current Outpatient Medications  Medication Sig Dispense Refill   aspirin 81 MG EC tablet  Take 81 mg by mouth daily.      chlorhexidine (PERIDEX) 0.12 % solution Use as directed 15 mLs in the mouth or throat 2 (two) times daily as needed.     ketoconazole (NIZORAL) 2 % shampoo Apply to upper back and chest at beginning of shower daily and rinse off after 10 minutes for 14 days. (Patient not taking: Reported on 12/13/2022) 120 mL 1   triamcinolone cream (KENALOG) 0.1 % Apply 1 Application topically 2 (two) times daily. 30 g 0   Current Facility-Administered Medications  Medication Dose Route Frequency Provider Last Rate Last Admin   0.9 %  sodium chloride infusion  500 mL Intravenous Continuous Meryl Dare, MD        Allergies as of 12/27/2022   (No Known Allergies)    Family History  Problem Relation Age of Onset   Diabetes Mellitus II Mother    Hypercholesterolemia Father    Diabetes Mellitus II Sister    Stomach cancer Maternal Grandmother    Colon polyps Neg Hx    Colon cancer Neg Hx    Hemochromatosis Neg Hx    Esophageal cancer Neg Hx    Rectal cancer Neg Hx     Social History   Socioeconomic History   Marital status: Married    Spouse name: Gerje   Number of children: 0   Years of education: Not on file   Highest education level: Not on file  Occupational History   Occupation: Theatre stage manager as accountant/parttime    Comment: Airline pilot in [place]  Tobacco Use   Smoking status: Never    Passive exposure: Never   Smokeless tobacco: Never  Vaping Use   Vaping status: Never Used  Substance and Sexual Activity   Alcohol use: Yes    Alcohol/week: 1.0 standard drink of alcohol    Types: 1 Glasses of wine per week   Drug use: No   Sexual activity: Yes  Other Topics Concern   Not on file  Social History Narrative   [place] with [place]   Originally from [place]   No longer lives with [place]   Social Determinants of Health   Financial Resource Strain: Low Risk  (11/01/2022)   Overall Financial Resource Strain (CARDIA)     Difficulty of Paying Living Expenses: Not hard at all  Food Insecurity: No Food Insecurity (11/01/2022)   Hunger Vital Sign    Worried About Running Out of Food in the Last Year: Never true    Ran Out of Food in the Last Year: Never true  Transportation Needs: No Transportation Needs (11/01/2022)   PRAPARE - Administrator, Civil Service (Medical): No    Lack of Transportation (Non-Medical): No  Physical Activity: Insufficiently Active (05/04/2018)   Exercise Vital Sign    Days of Exercise per Week: 5 days    Minutes of Exercise per Session: 10 min  Stress: Not on file  Social Connections: Not on file  Intimate Partner Violence: Not At Risk (11/01/2022)   Humiliation, Afraid, Rape, and Kick questionnaire    Fear of Current or Ex-Partner: No    Emotionally Abused: No    Physically Abused: No    Sexually Abused: No    Review of Systems:  All systems reviewed were negative except where noted in HPI.   Physical Exam:  General:  Alert, well-developed, in NAD Head:  Normocephalic and atraumatic. Eyes:  Sclera clear, no icterus.   Conjunctiva pink. Ears:  Normal auditory acuity. Mouth:  No deformity or lesions.  Neck:  Supple; no masses. Lungs:  Clear throughout to auscultation.   No wheezes, crackles, or rhonchi.  Heart:  Regular rate and rhythm; no murmurs. Abdomen:  Soft, nondistended, nontender. No masses, hepatomegaly. No palpable masses.  Normal bowel sounds.    Rectal:  Deferred   Msk:  Symmetrical without gross deformities. Extremities:  Without edema. Neurologic:  Alert and  oriented x 4; grossly normal neurologically. Skin:  Intact without significant lesions or rashes. Psych:  Alert and cooperative. Normal mood and affect.   Venita Lick. Russella Dar  12/27/2022, 1:54 PM See Loretha Stapler, [facility], to contact our on call provider

## 2022-12-28 ENCOUNTER — Telehealth: Payer: Self-pay

## 2022-12-28 NOTE — Telephone Encounter (Signed)
  Follow up Call-     12/27/2022    1:48 PM  Call back number  Post procedure Call Back phone  # [phone]  Permission to leave phone message Yes     Patient questions:  Do you have a fever, pain , or abdominal swelling? No. Pain Score  0 *  Have you tolerated food without any problems? Yes.    Have you been able to return to your normal activities? Yes.    Do you have any questions about your discharge instructions: Diet   No. Medications  No. Follow up visit  No.  Do you have questions or concerns about your Care? No.  Actions: * If pain score is 4 or above: No action needed, pain <4.

## 2022-12-30 LAB — SURGICAL PATHOLOGY

## 2023-01-03 ENCOUNTER — Encounter: Payer: Self-pay | Admitting: Gastroenterology

## 2023-01-18 ENCOUNTER — Other Ambulatory Visit: Payer: BLUE CROSS/BLUE SHIELD

## 2023-01-18 DIAGNOSIS — E7800 Pure hypercholesterolemia, unspecified: Secondary | ICD-10-CM | POA: Diagnosis not present

## 2023-01-19 LAB — LIPID PANEL W/O CHOL/HDL RATIO
Cholesterol, Total: 258 mg/dL — ABNORMAL HIGH (ref 100–199)
HDL: 80 mg/dL
LDL Chol Calc (NIH): 162 mg/dL — ABNORMAL HIGH (ref 0–99)
Triglycerides: 96 mg/dL (ref 0–149)
VLDL Cholesterol Cal: 16 mg/dL (ref 5–40)

## 2023-02-01 ENCOUNTER — Other Ambulatory Visit: Payer: BLUE CROSS/BLUE SHIELD

## 2023-04-07 ENCOUNTER — Telehealth: Payer: Self-pay

## 2023-04-07 ENCOUNTER — Encounter: Payer: Self-pay | Admitting: Internal Medicine

## 2023-04-07 ENCOUNTER — Other Ambulatory Visit: Payer: Self-pay | Admitting: Internal Medicine

## 2023-04-07 DIAGNOSIS — N63 Unspecified lump in unspecified breast: Secondary | ICD-10-CM

## 2023-04-07 NOTE — Telephone Encounter (Signed)
Patient has an after lab appointment on 07/13/2023.  Patient would like for the May appointment to be sooner between 05/05/2023 to 06/06/2023.  We will call patient if there is a cancellation.

## 2023-04-28 IMAGING — MG MM DIGITAL DIAGNOSTIC UNILAT*L* W/ TOMO W/ CAD
4 series · 4 of 12 positions shown · non-contrast
Comparison: Previous exams including recent screening mammogram
dated 01/08/2021.

CLINICAL DATA: Patient returns today to evaluate a possible LEFT
breast mass questioned on recent screening mammogram.

EXAM:
DIGITAL DIAGNOSTIC UNILATERAL LEFT MAMMOGRAM WITH TOMOSYNTHESIS AND
CAD; ULTRASOUND LEFT BREAST LIMITED
TECHNIQUE: Left digital diagnostic mammography and breast tomosynthesis was
performed. The images were evaluated with computer-aided detection.;
Targeted ultrasound examination of the left breast was performed.

[L MLO synth-2D]
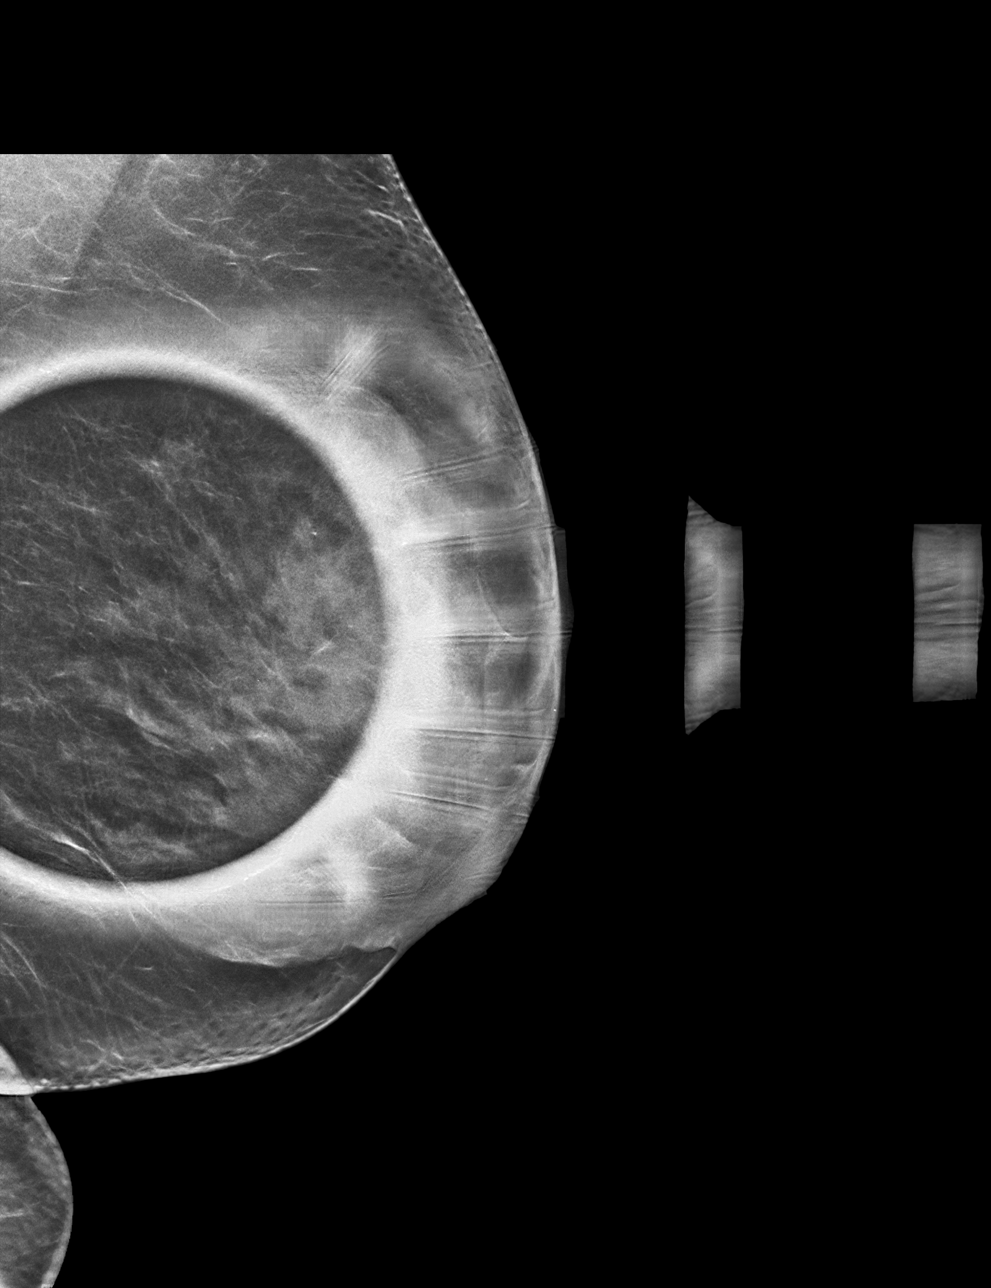

[L ML synth-2D]
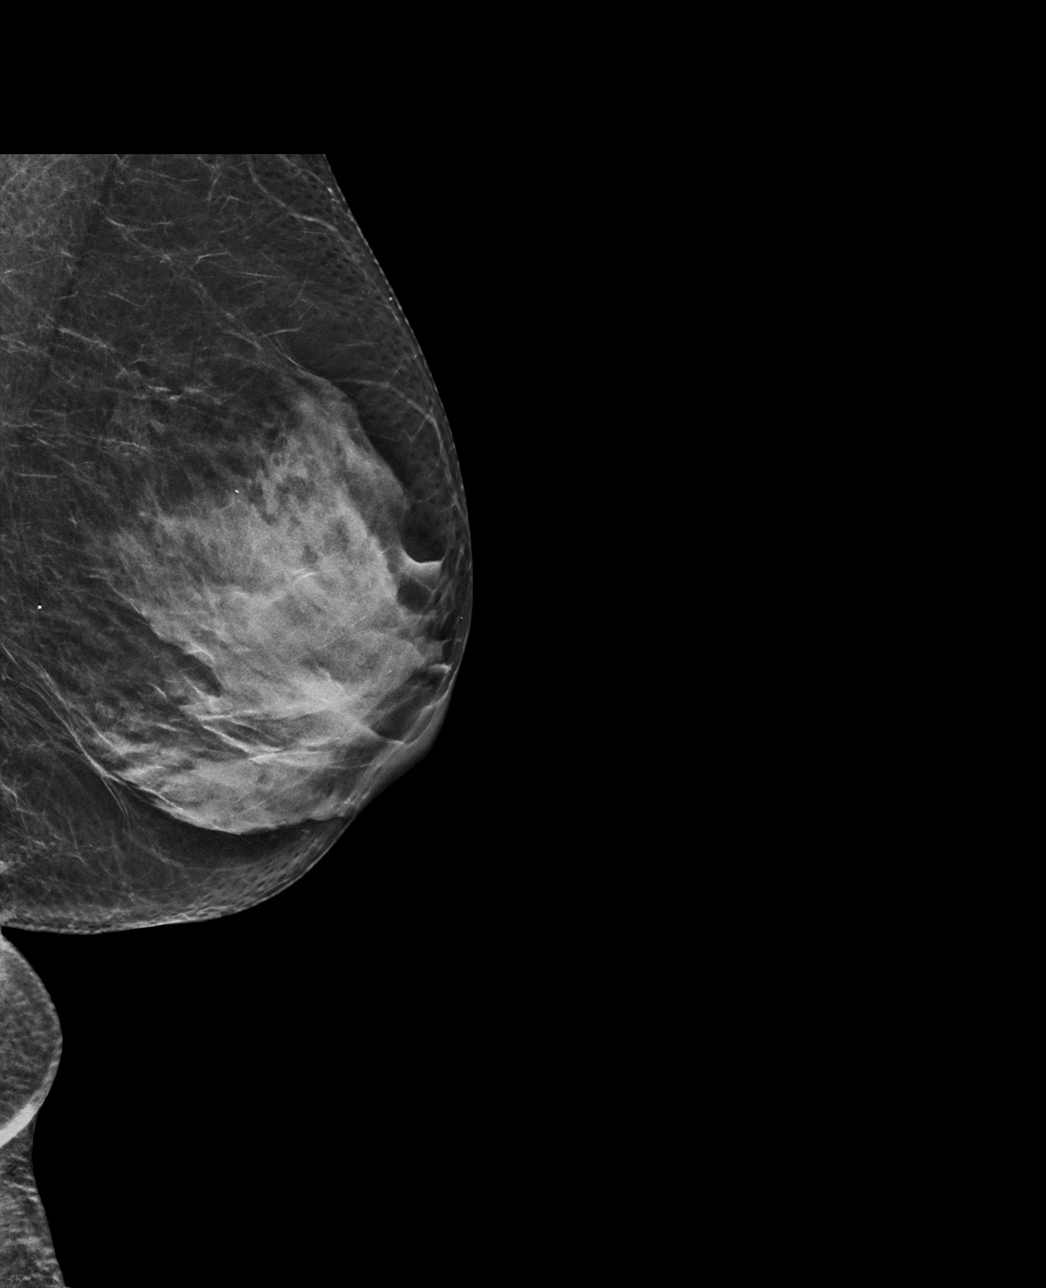

[L MLO tomo · tomo slice 30/59.0]
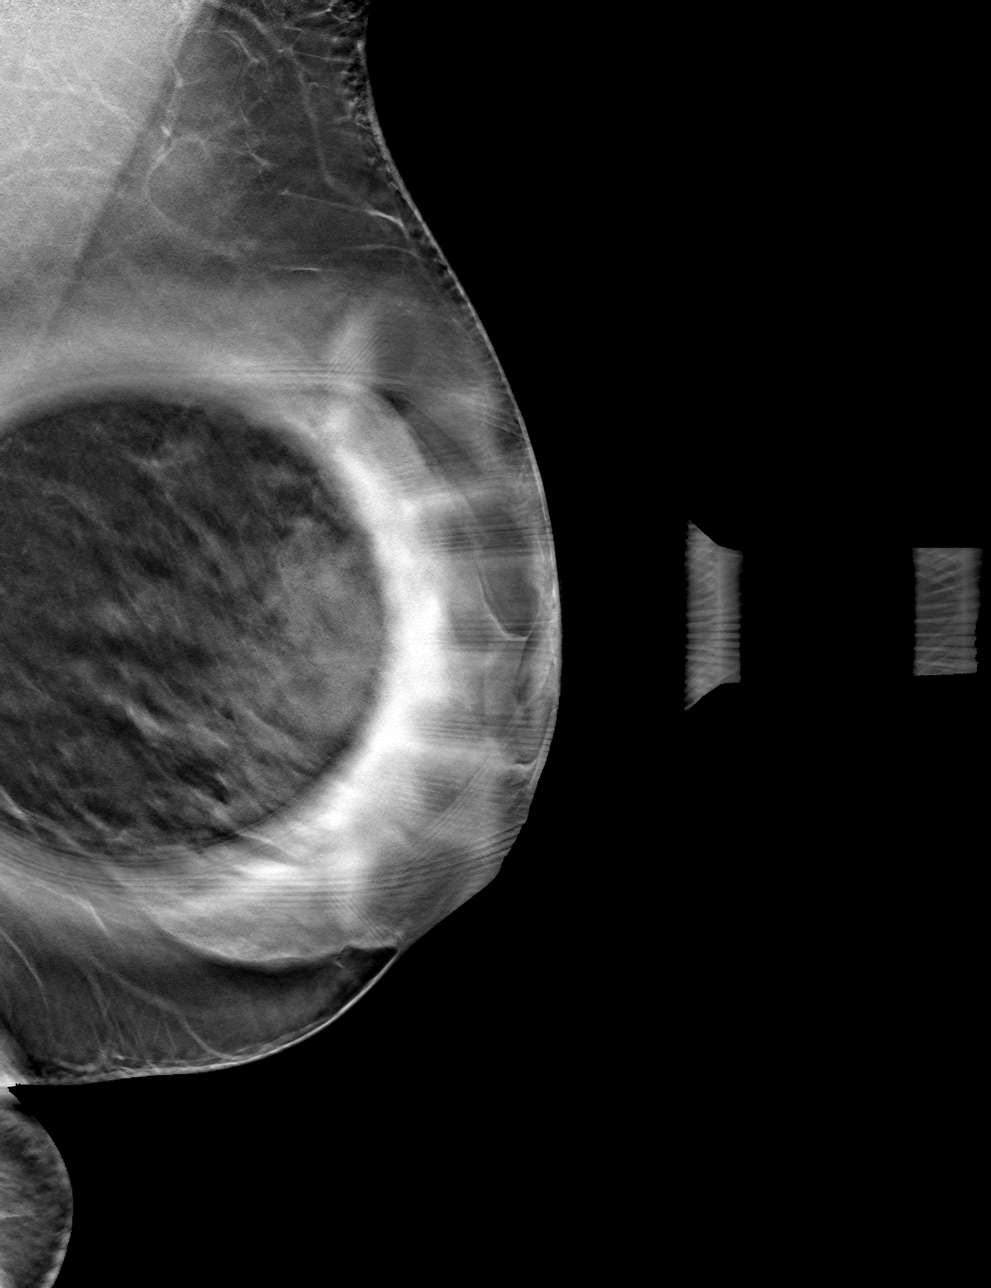

[L ML tomo · tomo slice 31/60.0]
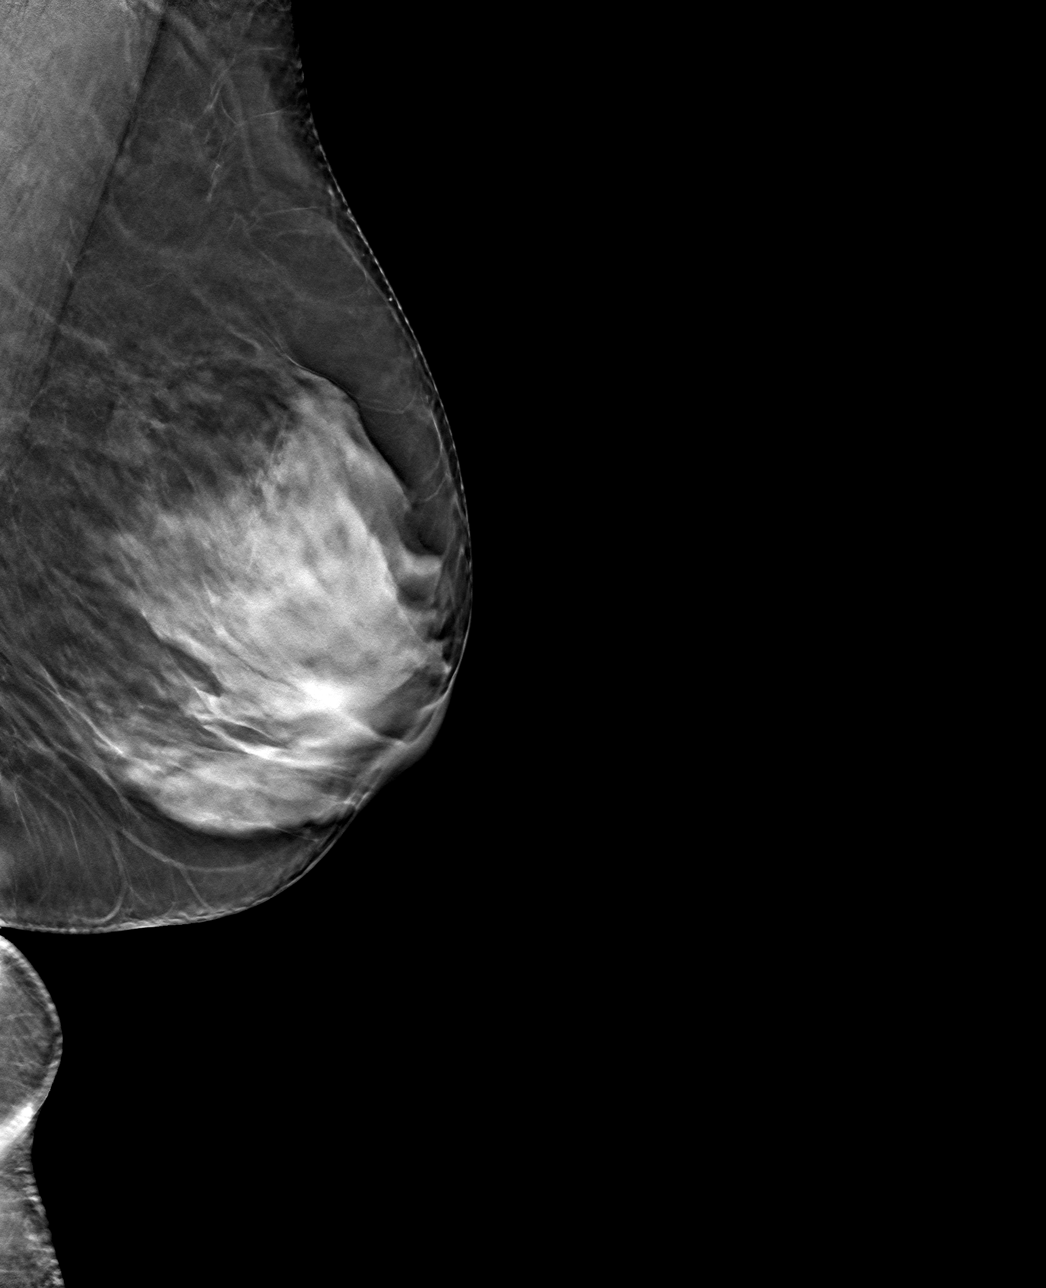

[4 of 12 positions shown; findings below may reference images not displayed]

ACR Breast Density Category c: The breast tissue is heterogeneously
dense, which may obscure small masses.
FINDINGS: On today's additional diagnostic views, including spot compression
with 3D tomosynthesis, there is no persistent asymmetry within the
LEFT breast suggesting superimposition of normal dense
fibroglandular tissues. Ultrasound will be performed to ensure
benignity.

Targeted ultrasound is performed, showing an oval hypoechoic area in
the LEFT breast at the 3 o'clock axis, 3 cm from nipple, measuring
1.1 x 0.5 x 0.9 cm, most suggestive of benign fat lobule during
real-time ultrasound evaluation (cine clips included). No suspicious
solid or cystic mass is identified within the outer LEFT breast by
ultrasound.
IMPRESSION: Probably benign fat lobule within the LEFT breast at the 3 o'clock
axis, 3 cm from the nipple, measuring 1.1 cm. Recommend follow-up
LEFT breast diagnostic mammogram and ultrasound in 6 months to
ensure stability.

RECOMMENDATION:
LEFT breast diagnostic mammogram and ultrasound in 6 months.

I have discussed the findings and recommendations with the patient
and her husband with the aid of an interpreter. If applicable, a
reminder letter will be sent to the patient regarding the next
appointment.

BI-RADS CATEGORY  3: Probably benign.

## 2023-04-29 ENCOUNTER — Other Ambulatory Visit: Payer: Self-pay

## 2023-04-29 ENCOUNTER — Other Ambulatory Visit: Payer: BC Managed Care – PPO

## 2023-04-29 DIAGNOSIS — E7800 Pure hypercholesterolemia, unspecified: Secondary | ICD-10-CM | POA: Diagnosis not present

## 2023-04-30 LAB — LIPID PANEL W/O CHOL/HDL RATIO
Cholesterol, Total: 261 mg/dL — ABNORMAL HIGH (ref 100–199)
HDL: 81 mg/dL (ref >39)
LDL Chol Calc (NIH): 158 mg/dL — ABNORMAL HIGH (ref 0–99)
Triglycerides: 128 mg/dL (ref 0–149)
VLDL Cholesterol Cal: 22 mg/dL (ref 5–40)

## 2023-05-05 ENCOUNTER — Ambulatory Visit: Payer: BC Managed Care – PPO | Admitting: Internal Medicine

## 2023-05-06 NOTE — Telephone Encounter (Signed)
 Patient has been scheduled for 05/10/2023.

## 2023-05-06 NOTE — Telephone Encounter (Signed)
Called patient to offer appointment, patient did not answer.

## 2023-05-10 ENCOUNTER — Ambulatory Visit: Payer: BC Managed Care – PPO | Admitting: Internal Medicine

## 2023-05-10 ENCOUNTER — Encounter: Payer: Self-pay | Admitting: Internal Medicine

## 2023-05-10 VITALS — BP 122/62 | HR 71 | Resp 20 | Ht <= 58 in | Wt 150.0 lb

## 2023-05-10 DIAGNOSIS — R519 Headache, unspecified: Secondary | ICD-10-CM | POA: Diagnosis not present

## 2023-05-10 DIAGNOSIS — H6692 Otitis media, unspecified, left ear: Secondary | ICD-10-CM

## 2023-05-10 DIAGNOSIS — E7800 Pure hypercholesterolemia, unspecified: Secondary | ICD-10-CM | POA: Diagnosis not present

## 2023-05-10 MED ORDER — AMOXICILLIN 875 MG PO TABS: 875.0000 mg | ORAL_TABLET | Freq: Two times a day (BID) | ORAL | 0 refills | Status: AC

## 2023-05-10 NOTE — Progress Notes (Signed)
    Subjective:    Patient ID: Elizabeth Ruiz, female   DOB: 27-Jun-1955, 68 y.o.   MRN: [id number]   HPI   Hypercholesterolemia with very good HDL:  Parents both died at elderly ages:  43 and 106.  All siblings with high cholesterol.  No cardiovascular disease.  2.  Pain across frontal head area 3 days ago.  Awakened her from sleep at about 2 a.m. after going to bed around 10:30 pm.  States the pain was very light and is unable to characterize further.  States she was up and down throughout the night.  She really just stayed in bed and thought she would drink caffeine in the morning.  Was able to fall back to sleep each time.  Arose from bed at 7 a.m., her usual time.  She drank ginger tea.  States the frontal headache pain became suddenly stronger, again she is unable to characterize.  Lasted less than a minute and then decreased to the previous level of mild.  Two hours later, even mild pain resolved.   No nausea or vomiting.   No numbness, tingling, or weakness in face, arms or legs.   No visual disturbance. No photophobia or phonophobia.   Has never had this happen before.  She states she was swimming 1 week ago and feels she get water in her ear.  She put cotton in her ear and noted yellow discharge on the cotton.  She did not do anything about the ear--no medication. She still has discomfort in her ear--has tinnitus as well.   Husband checked her BP and was 140/78  Current Meds  Medication Sig   aspirin 81 MG EC tablet Take 81 mg by mouth daily.    No Known Allergies   Review of Systems    Objective:   BP 122/62 (BP Location: Right Arm, Patient Position: Sitting, Cuff Size: Normal)   Pulse 71   Resp 20   Ht 4' 9.5" (1.461 m)   Wt 150 lb (68 kg)   SpO2 99%   BMI 31.90 kg/m   Physical Exam NAD HEENT:  PERRL, EOMI, not able to see eye grounds clearly due to small pupils.  Left  TM with chronic thickening, wetness and mild erythema.  NT over frontal and Maxillary area.    Neck:  Supple, No adenopathy Chest:  CTA CV:  RRR without murmur or rub.  Radial and DP pulses normal and equal Abd:  S, NT, + BS Neuro:  A & O x 3, CN II-XII grossly intact.  Motor 5/5.  DTRs 2+/4.  Gait normal.  NT over temporal arteries and elastic feeling. Assessment and Plan:    Hypercholesterolemia:  Encouraged her to continue to work on lifestyle modifications.  Other than hypercholesterolemia, minimal risk for CV disease. She is not interested in statin as well.  Follow .    2.  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  Not clear if related to OM of left ear.  Treat LOM with Amoxicillin 875 mg twice daily.  To call if recurrent headache.  History of abnormal left TM

## 2023-05-11 ENCOUNTER — Other Ambulatory Visit: Payer: BLUE CROSS/BLUE SHIELD

## 2023-07-08 ENCOUNTER — Other Ambulatory Visit: Payer: BC Managed Care – PPO

## 2023-07-13 ENCOUNTER — Ambulatory Visit: Payer: BC Managed Care – PPO | Admitting: Internal Medicine

## 2023-07-29 ENCOUNTER — Other Ambulatory Visit (INDEPENDENT_AMBULATORY_CARE_PROVIDER_SITE_OTHER): Payer: BC Managed Care – PPO

## 2023-07-29 DIAGNOSIS — E7800 Pure hypercholesterolemia, unspecified: Secondary | ICD-10-CM

## 2023-07-30 LAB — LIPID PANEL W/O CHOL/HDL RATIO
Cholesterol, Total: 231 mg/dL — ABNORMAL HIGH (ref 100–199)
HDL: 71 mg/dL (ref >39)
LDL Chol Calc (NIH): 145 mg/dL — ABNORMAL HIGH (ref 0–99)
Triglycerides: 88 mg/dL (ref 0–149)
VLDL Cholesterol Cal: 15 mg/dL (ref 5–40)

## 2023-08-04 ENCOUNTER — Telehealth: Payer: Self-pay

## 2023-08-04 NOTE — Telephone Encounter (Signed)
 Pcp states patient needs appt for headaches

## 2023-08-05 NOTE — Telephone Encounter (Signed)
 Appt scheduled

## 2023-08-08 ENCOUNTER — Encounter: Payer: Self-pay | Admitting: Internal Medicine

## 2023-08-08 ENCOUNTER — Ambulatory Visit: Payer: Self-pay | Admitting: Internal Medicine

## 2023-08-08 VITALS — BP 122/80 | HR 70 | Resp 16 | Ht <= 58 in | Wt 149.0 lb

## 2023-08-08 DIAGNOSIS — M549 Dorsalgia, unspecified: Secondary | ICD-10-CM

## 2023-08-08 DIAGNOSIS — G44209 Tension-type headache, unspecified, not intractable: Secondary | ICD-10-CM

## 2023-08-08 MED ORDER — CYCLOBENZAPRINE HCL 5 MG PO TABS: ORAL_TABLET | ORAL | 1 refills | Status: AC

## 2023-08-08 MED ORDER — IBUPROFEN 200 MG PO TABS: ORAL_TABLET | ORAL | 0 refills | Status: AC

## 2023-08-08 NOTE — Patient Instructions (Signed)
 Heating pad and stretches every night.

## 2023-08-08 NOTE — Progress Notes (Signed)
    Subjective:    Patient ID: Elizabeth Ruiz, female   DOB: [birth year], 68 y.o.   MRN: [id number]   HPI  Has had 3 episodes of pain starting in left upper back and up her neck to occipital area.  Has had this occur monthly for the past 3 months.  If she stretches her neck to the right, the pain is improved, but recurs.  Her husband will massage and then improves.   She does not have numbness and tingling of hands or arms as long as she wears her cock up splints.  Has history of CTS bilaterally.   She has used Voltaren  Cream and seems to help.   She sleeps on left side, but does not necessarily support her neck.    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  Looking up at computers the way her chair is situated.    Hypercholesterolemia:  Good HDL, but LDL gradually coming down, though still a bit high at 145.  Current Meds  Medication Sig   aspirin 81 MG EC tablet Take 81 mg by mouth daily.    No Known Allergies   Review of Systems    Objective:   BP 122/80 (BP Location: Left Arm, Patient Position: Sitting, Cuff Size: Normal)   Pulse 70   Resp 16   Ht 4' 9.5" (1.461 m)   Wt 149 lb (67.6 kg)   SpO2 97%   BMI 31.68 kg/m   Physical Exam NAD HEENT:  PERRL, EOMI Lungs:  CTA CV:  RRR without murmur or rub  radial and DP pulses normal and equal MS:  NT down length of spine over spinous processes.   Tender, muscle spasm on left between medial scapula and spinous process--continues to neck and nuchal ridge.   Muscle softer on right in same area with milder tenderness on right starting more so at neck.   Neuro, UE:  Motor 5/5, DTRs 2+/4 throughout   Assessment & Plan  Muscle spasm of left much greater than right thoracic and cervical paraspinous musculature with resulting tension HA Ibuprofen 400 to 800 mg twice daily with morning and evening meals.   Cyclobenzaprine 2.5 to 5 mg at bedtime as needed for severe spasm. Heating pad for 20 minutes followed by stretches  nightly Good core posture. Referral to [facility] PT when returns to area.   To see about PT in [place] when visiting there.    Hypercholesterolemia;  improved, though LDL still not quite at goal

## 2024-01-16 ENCOUNTER — Ambulatory Visit
Admission: RE | Admit: 2024-01-16 | Discharge: 2024-01-16 | Disposition: A | Source: Ambulatory Visit | Attending: Internal Medicine | Admitting: Internal Medicine

## 2024-01-16 ENCOUNTER — Ambulatory Visit
Admission: RE | Admit: 2024-01-16 | Discharge: 2024-01-16 | Disposition: A | Discharge status: Home / Self Care | Source: Ambulatory Visit | Attending: Internal Medicine | Admitting: Internal Medicine

## 2024-01-16 DIAGNOSIS — N63 Unspecified lump in unspecified breast: Secondary | ICD-10-CM
